# Patient Record
Sex: Female | Born: 1960 | Race: White | Hispanic: No | Marital: Married | Smoking: Never smoker
Health system: Southern US, Community
[De-identification: ages and names within clinical notes are randomized; demographics above are authoritative.]

## PROBLEM LIST (undated history)

## (undated) DIAGNOSIS — F419 Anxiety disorder, unspecified: Secondary | ICD-10-CM

## (undated) DIAGNOSIS — B001 Herpesviral vesicular dermatitis: Secondary | ICD-10-CM

## (undated) HISTORY — DX: Herpesviral vesicular dermatitis: B00.1

## (undated) HISTORY — DX: Anxiety disorder, unspecified: F41.9

---

## 2000-03-25 ENCOUNTER — Emergency Department (HOSPITAL_COMMUNITY): Admission: EM | Admit: 2000-03-25 | Discharge: 2000-03-25 | Payer: Self-pay | Admitting: Emergency Medicine

## 2000-03-26 ENCOUNTER — Encounter: Payer: Self-pay | Admitting: Emergency Medicine

## 2002-03-03 ENCOUNTER — Other Ambulatory Visit: Admission: RE | Admit: 2002-03-03 | Discharge: 2002-03-03 | Payer: Self-pay | Admitting: Family Medicine

## 2005-06-23 ENCOUNTER — Emergency Department (HOSPITAL_COMMUNITY): Admission: EM | Admit: 2005-06-23 | Discharge: 2005-06-23 | Payer: Self-pay | Admitting: Emergency Medicine

## 2005-07-15 HISTORY — PX: ANKLE ARTHROPLASTY: SUR68

## 2005-10-01 ENCOUNTER — Other Ambulatory Visit: Admission: RE | Admit: 2005-10-01 | Discharge: 2005-10-01 | Payer: Self-pay | Admitting: Family Medicine

## 2005-12-23 ENCOUNTER — Emergency Department (HOSPITAL_COMMUNITY): Admission: EM | Admit: 2005-12-23 | Discharge: 2005-12-23 | Payer: Self-pay | Admitting: Emergency Medicine

## 2006-01-06 ENCOUNTER — Inpatient Hospital Stay (HOSPITAL_COMMUNITY): Admission: EM | Admit: 2006-01-06 | Discharge: 2006-01-09 | Payer: Self-pay | Admitting: Emergency Medicine

## 2007-02-24 IMAGING — CR DG ANKLE PORT 2V*L*
2 series · 2 of 2 positions shown · non-contrast
Comparison: none

CLINICAL DATA: Fell down steps, ankle deformity. 
 PORTABLE LEFT ANKLE ? 2 VIEW:

[view not recorded (1 of 2)]
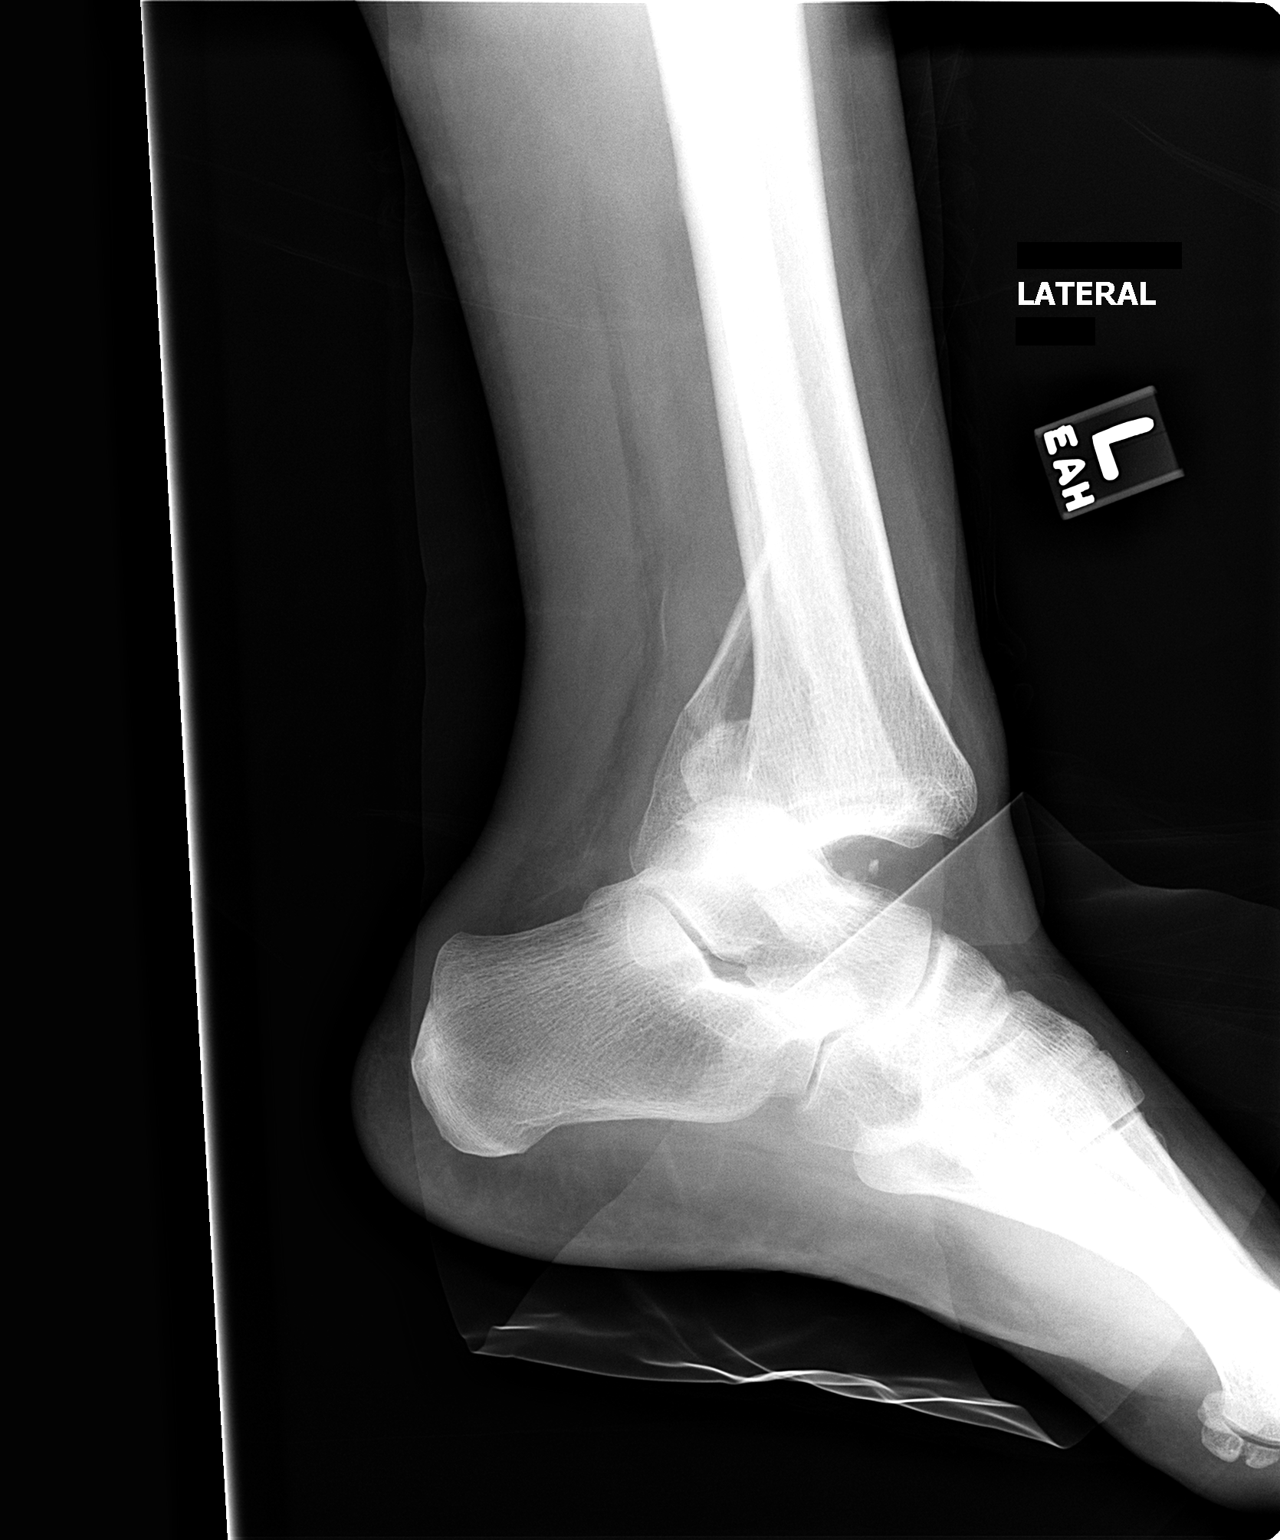

[view not recorded (2 of 2)]
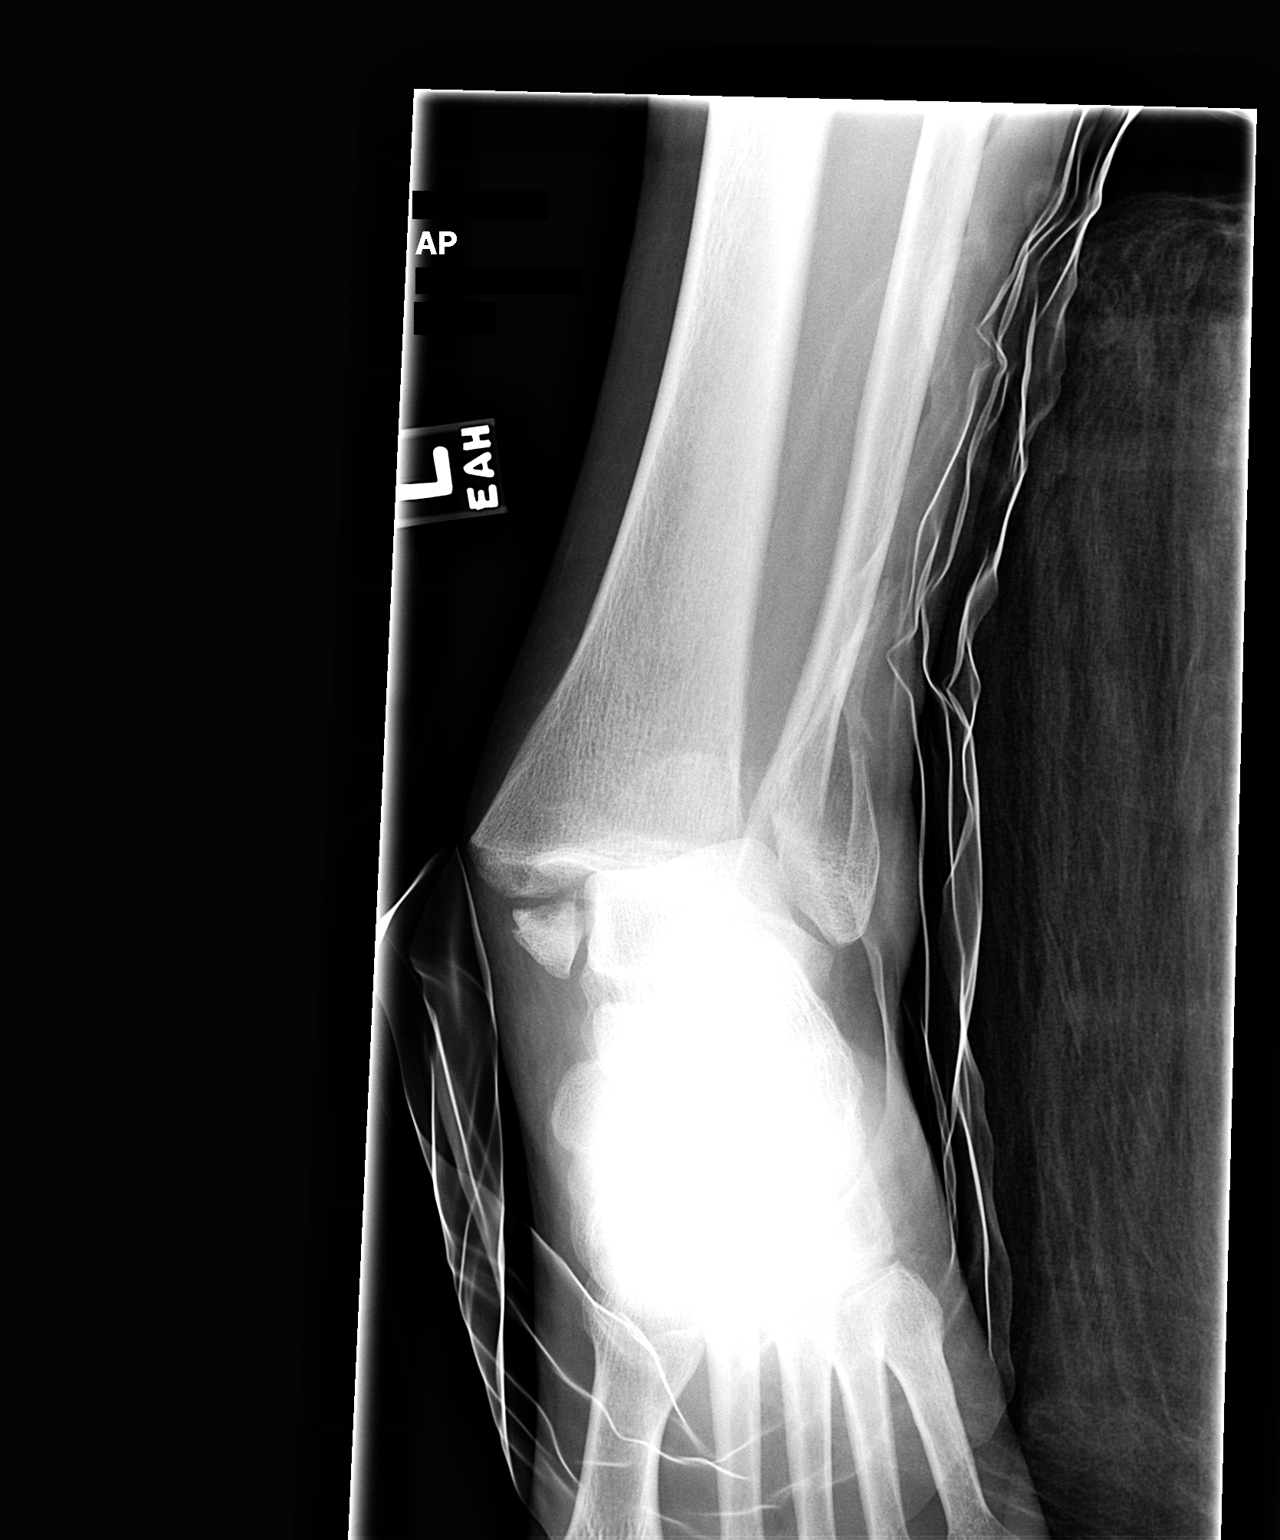

[2 of 2 positions shown; findings below may reference images not displayed]

FINDINGS: There is a trimalleolar fracture/dislocation.  The talus is dislocated posteriorly relative to the distal tibial articular surface. There is a fracture of the posterior lip of the tibia, impacted upon the talar dome.  There is an oblique fracture of the distal fibula.  There is a transverse fracture of the medial malleolus.
IMPRESSION: Trimalleolar fracture/dislocation.

## 2007-02-24 IMAGING — CR DG ANKLE PORT 2V*L*
3 series · 3 of 3 positions shown · non-contrast
Comparison: Earlier films from today.

CLINICAL DATA: Left ankle fracture-dislocation.
 PORTABLE LEFT ANKLE ? 01/06/06:

[view not recorded (1 of 3)]
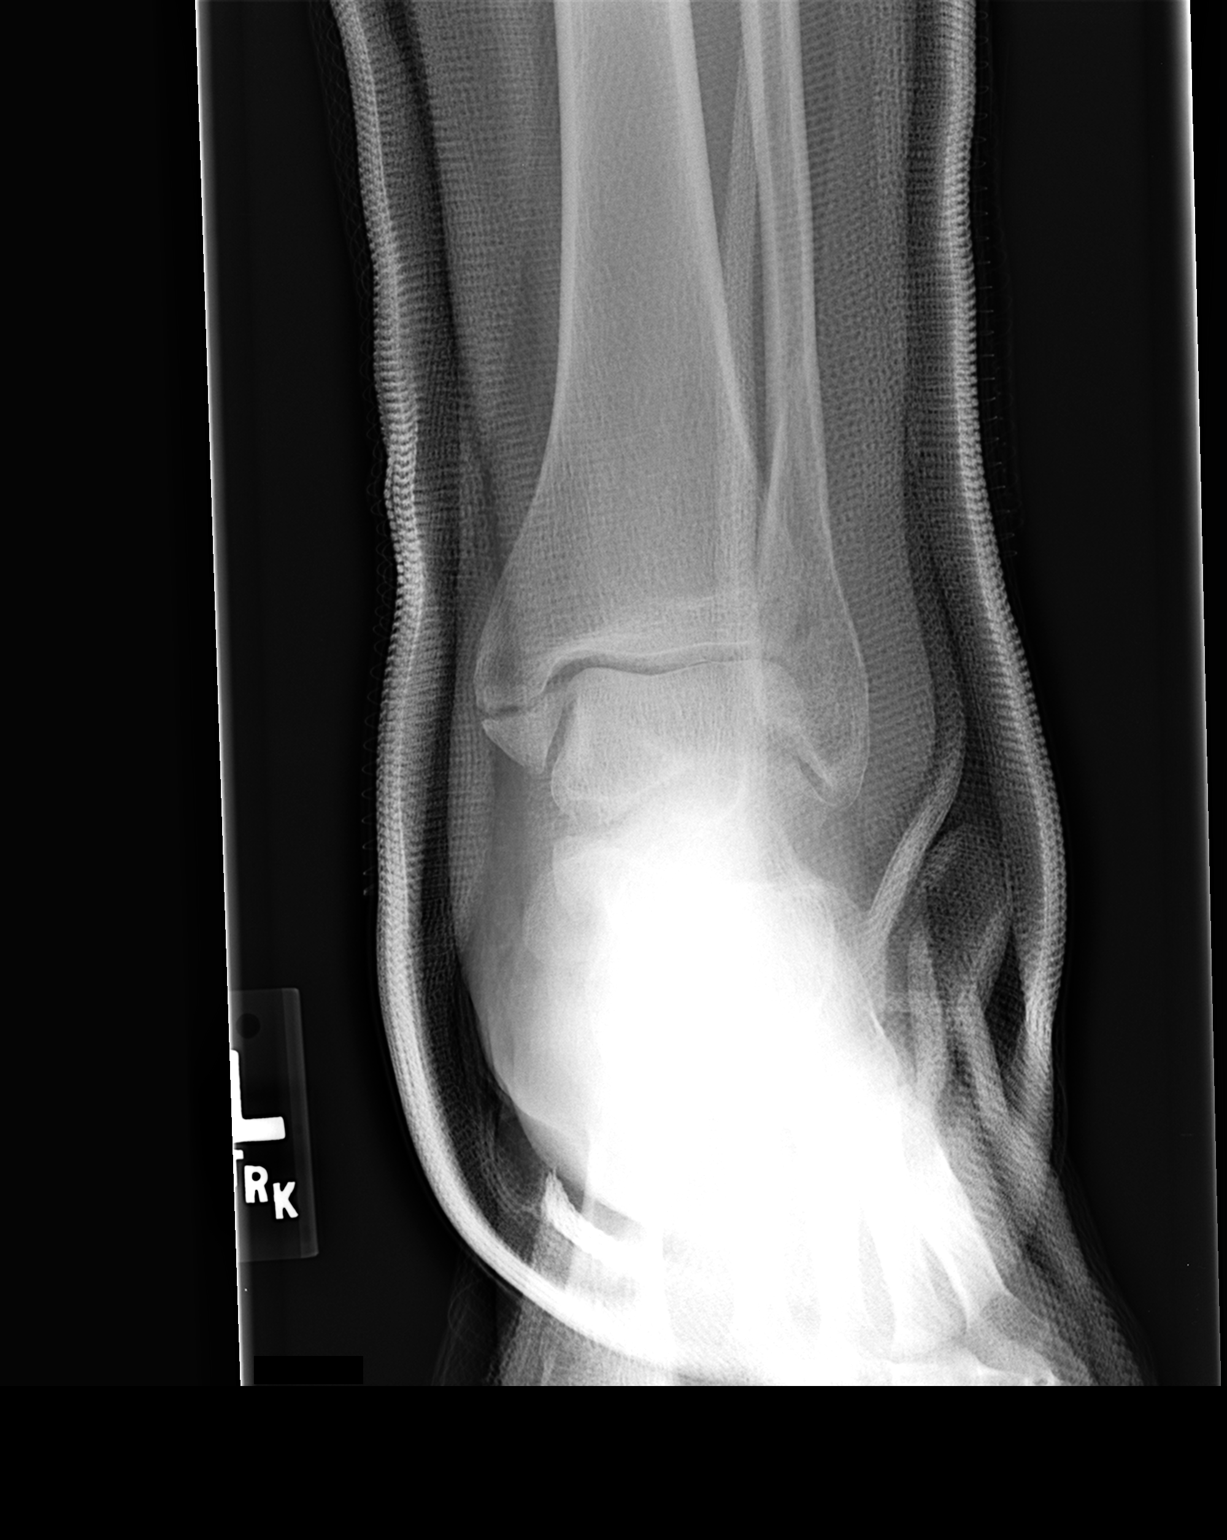

[view not recorded (2 of 3)]
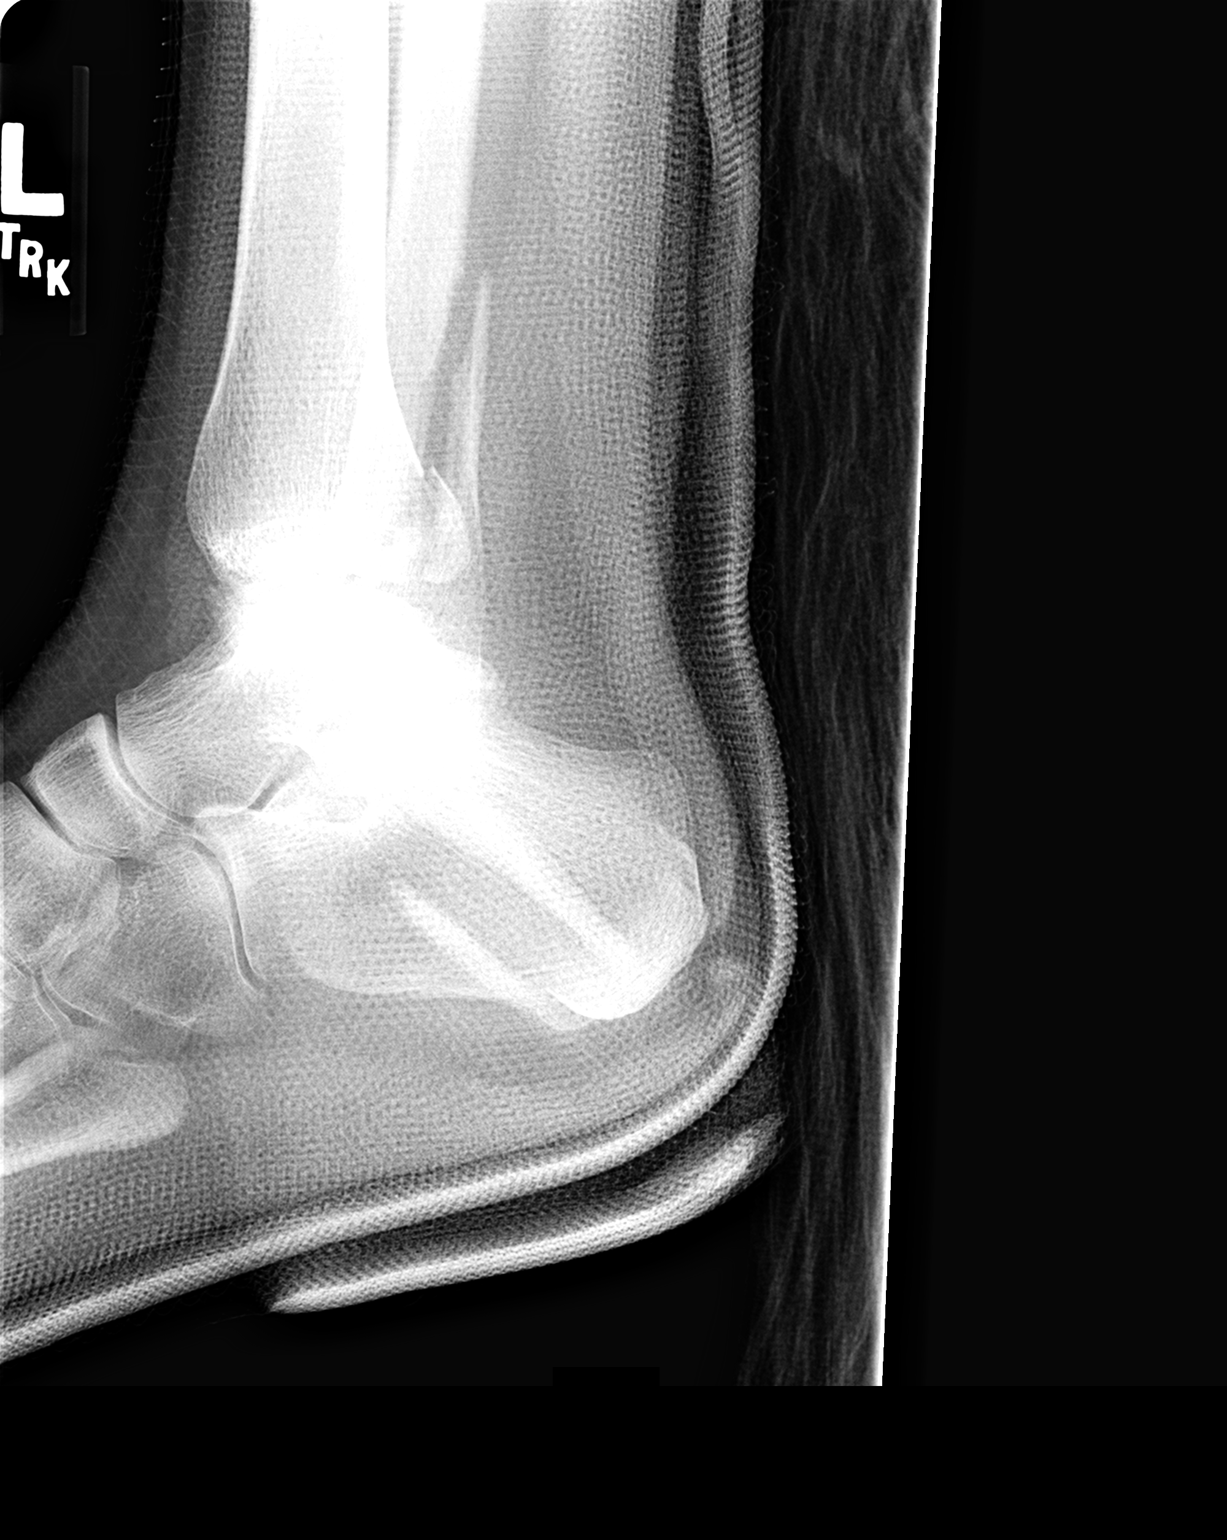

[view not recorded (3 of 3)]
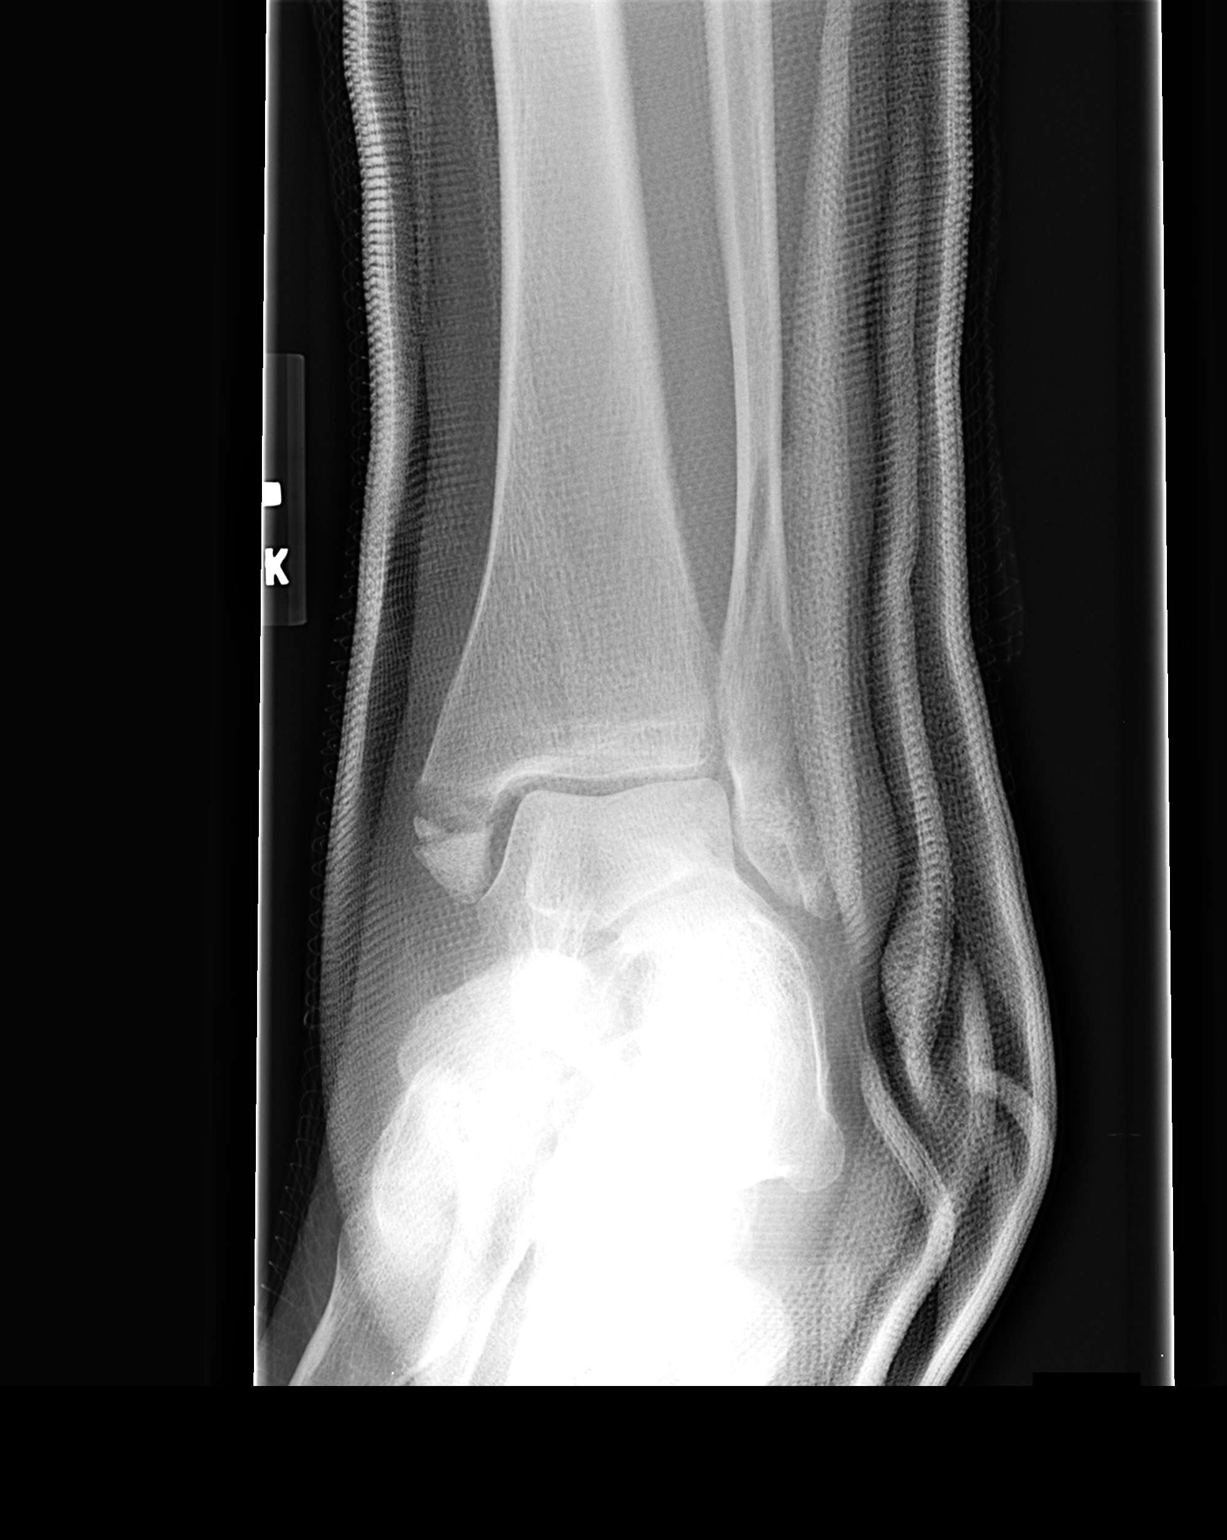

[3 of 3 positions shown; findings below may reference images not displayed]

FINDINGS: Interval reduction of the left ankle fracture-dislocation. The patient is in a fiberglass cast.  The ankle mortise is fairly well maintained.
IMPRESSION: Reduction of left ankle fracture-dislocation.

## 2010-08-06 ENCOUNTER — Encounter: Payer: Self-pay | Admitting: Family Medicine

## 2013-11-01 ENCOUNTER — Ambulatory Visit: Payer: Self-pay | Admitting: Podiatry

## 2016-03-26 ENCOUNTER — Other Ambulatory Visit: Payer: Self-pay | Admitting: Family Medicine

## 2016-03-26 DIAGNOSIS — Z1231 Encounter for screening mammogram for malignant neoplasm of breast: Secondary | ICD-10-CM

## 2016-04-15 ENCOUNTER — Ambulatory Visit
Admission: RE | Admit: 2016-04-15 | Discharge: 2016-04-15 | Disposition: A | Discharge status: Home / Self Care | Payer: BC Managed Care – PPO | Source: Ambulatory Visit | Attending: Family Medicine | Admitting: Family Medicine

## 2016-04-15 DIAGNOSIS — Z1231 Encounter for screening mammogram for malignant neoplasm of breast: Secondary | ICD-10-CM

## 2016-07-15 HISTORY — PX: TOE SURGERY: SHX1073

## 2019-06-18 ENCOUNTER — Other Ambulatory Visit: Payer: Self-pay | Admitting: Family Medicine

## 2019-06-18 ENCOUNTER — Other Ambulatory Visit (HOSPITAL_COMMUNITY)
Admission: RE | Admit: 2019-06-18 | Discharge: 2019-06-18 | Disposition: A | Discharge status: Home / Self Care | Payer: BC Managed Care – PPO | Source: Ambulatory Visit | Attending: Family Medicine | Admitting: Family Medicine

## 2019-06-18 DIAGNOSIS — Z124 Encounter for screening for malignant neoplasm of cervix: Secondary | ICD-10-CM | POA: Insufficient documentation

## 2019-06-18 LAB — RESULTS CONSOLE HPV: CHL HPV: NEGATIVE

## 2019-06-18 LAB — HM PAP SMEAR: HM Pap smear: NEGATIVE

## 2019-06-22 LAB — CYTOLOGY - PAP
Comment: NEGATIVE
Diagnosis: NEGATIVE
High risk HPV: NEGATIVE

## 2021-05-17 LAB — HM MAMMOGRAPHY

## 2021-06-14 LAB — HM COLONOSCOPY

## 2022-01-25 ENCOUNTER — Ambulatory Visit: Payer: BC Managed Care – PPO | Admitting: Podiatry

## 2022-02-01 ENCOUNTER — Ambulatory Visit: Payer: BC Managed Care – PPO | Admitting: Podiatry

## 2022-02-13 ENCOUNTER — Ambulatory Visit (INDEPENDENT_AMBULATORY_CARE_PROVIDER_SITE_OTHER): Payer: BC Managed Care – PPO

## 2022-02-13 ENCOUNTER — Ambulatory Visit: Payer: BC Managed Care – PPO | Admitting: Podiatry

## 2022-02-13 DIAGNOSIS — M21622 Bunionette of left foot: Secondary | ICD-10-CM | POA: Diagnosis not present

## 2022-02-13 DIAGNOSIS — M24575 Contracture, left foot: Secondary | ICD-10-CM | POA: Diagnosis not present

## 2022-02-13 DIAGNOSIS — M2042 Other hammer toe(s) (acquired), left foot: Secondary | ICD-10-CM | POA: Diagnosis not present

## 2022-02-13 DIAGNOSIS — Z01818 Encounter for other preprocedural examination: Secondary | ICD-10-CM

## 2022-02-13 NOTE — Progress Notes (Signed)
Subjective:  Patient ID: Jennifer Soto, female    DOB: 1961-03-08,  MRN: [id number]  Chief Complaint  Patient presents with   Foot Pain     hx of left foot surgery toes turning(hammertoe) bil foot pain, hammer toes on bilateral feet, no pain. Patient experiencing numbness and tingling intermittently     61 y.o. female presents with the above complaint.  Patient presents with painful left fourth and fifth hammertoe contracture with pain on palpation.  Patient also has a left tailor's bunion which seems to hurt with ambulation issues.  She has not seen anyone else prior to seeing me.  She denies any other acute complaints she would like to discuss treatment options for some time to get numbness tingling from the pain.  She has tried shoe gear modification padding protecting offloading all conservative ankles and treatments.  She is known to Dr. Grandville Silos who had done most of the conservative treatment options she would like to discuss surgical options at this time.  Previous bunion surgeries were done by Dr. Grandville Silos   Review of Systems: Negative except as noted in the HPI. Denies N/V/F/Ch.  No past medical history on file.  Current Outpatient Medications:    acyclovir cream (ZOVIRAX) 5 %, 1 application to affected area 5 times a day, Disp: , Rfl:    citalopram (CELEXA) 40 MG tablet, Take 40 mg by mouth daily., Disp: , Rfl:    estradiol (ESTRACE) 0.1 MG/GM vaginal cream, See admin instructions., Disp: , Rfl:    valACYclovir (VALTREX) 1000 MG tablet, take 2 tablets every 12 hours are directed for 30 days, Disp: , Rfl:   Social History   Tobacco Use  Smoking Status Not on file  Smokeless Tobacco Not on file    No Known Allergies Objective:  There were no vitals filed for this visit. There is no height or weight on file to calculate BMI. Constitutional Well developed. Well nourished.  Vascular Dorsalis pedis pulses palpable bilaterally. Posterior tibial pulses palpable  bilaterally. Capillary refill normal to all digits.  No cyanosis or clubbing noted. Pedal hair growth normal.  Neurologic Normal speech. Oriented to person, place, and time. Epicritic sensation to light touch grossly present bilaterally.  Dermatologic Nails well groomed and normal in appearance. No open wounds. No skin lesions.  Orthopedic: Pain on palpation left fourth and fifth hammertoe contracture.  Pain on palpation to the tailor's bunion prominent lateral eminence noted.  No extensor or flexor tendinitis noted.  These are semireducible deformities   Radiographs: 3 views of skeletally mature adult left foot: Previous hardware noted which appears to be intact no signs of backing or loosening noted.  Hammertoe contracture fourth and fifth digit noted.  Tailor's bunion noted with mild increase in intermetatarsal angle Assessment:   1. Hammer toe of left foot   2. Joint contracture of foot, left   3. Tailor's bunion of left foot   4. Encounter for preoperative examination for general surgical procedure    Plan:  Patient was evaluated and treated and all questions answered.  Left fourth and fifth hammertoe contracture painful with tailor's bunion -All questions and concerns were discussed with the patient in extensive detail.  Given the amount of pain that she is experiencing she will benefit from surgical evaluation.  She has failed all conservative treatment options under Dr. Grandville Silos at this time she would like to discuss surgical options. -I discussed my preoperative intra postoperative plan in extensive detail.  I will plan on doing left  fourth fifth hammertoe PIPJ arthroplasty with capsulotomy of fourth metatarsophalangeal joint with possible wire fixation.  I will also plan on doing tailor's bunion with reverse Wilson procedure with screw fixation. -Informed surgical risk consent was reviewed and read aloud to the patient.  I reviewed the films.  I have discussed my findings with the  patient in great detail.  I have discussed all risks including but not limited to infection, stiffness, scarring, limp, disability, deformity, damage to blood vessels and nerves, numbness, poor healing, need for braces, arthritis, chronic pain, amputation, death.  All benefits and realistic expectations discussed in great detail.  I have made no promises as to the outcome.  I have provided realistic expectations.  I have offered the patient a 2nd opinion, which they have declined and assured me they preferred to proceed despite the risks   No follow-ups on file. 3  Left fourth and fifth hammertoe with a possible capsulotomy And left tailor's bunionsurgery surgical shoe was dispensed.  Had history of surgery with Dr. Grandville Silos

## 2022-04-04 ENCOUNTER — Telehealth: Payer: Self-pay | Admitting: Urology

## 2022-04-04 NOTE — Telephone Encounter (Signed)
DOS - 04/29/22  TAILORS BUNIONECTOMY LEFT --- [id number] HAMMERTOE REPAIR 4,5 LEFT --- [id number] CAPSULOTOMY MPJ RELEASE 4TH LEFT --- [id number]  [employer] EFFECTIVE DATE - 07/15/21  PLAN DEDUCTIBLE - $1,500.00 W/ $1,500.00 REMAINING OUT OF POCKET - $5,900.00 W/ $6,720.94 REMAINING COINSURANCE - 30% COPAY - $0.00   NO PRIOR AUTH IS REQUIRED.

## 2022-04-22 ENCOUNTER — Other Ambulatory Visit: Payer: Self-pay | Admitting: Nurse Practitioner

## 2022-04-22 ENCOUNTER — Encounter: Payer: Self-pay | Admitting: Nurse Practitioner

## 2022-04-22 ENCOUNTER — Other Ambulatory Visit: Payer: Self-pay

## 2022-04-22 ENCOUNTER — Ambulatory Visit: Payer: BC Managed Care – PPO | Admitting: Nurse Practitioner

## 2022-04-22 VITALS — BP 136/76 | HR 65 | Temp 97.3°F | Ht 65.0 in | Wt 173.0 lb

## 2022-04-22 DIAGNOSIS — Z1231 Encounter for screening mammogram for malignant neoplasm of breast: Secondary | ICD-10-CM

## 2022-04-22 DIAGNOSIS — F411 Generalized anxiety disorder: Secondary | ICD-10-CM | POA: Insufficient documentation

## 2022-04-22 DIAGNOSIS — Z23 Encounter for immunization: Secondary | ICD-10-CM | POA: Diagnosis not present

## 2022-04-22 DIAGNOSIS — N941 Unspecified dyspareunia: Secondary | ICD-10-CM | POA: Diagnosis not present

## 2022-04-22 DIAGNOSIS — Z124 Encounter for screening for malignant neoplasm of cervix: Secondary | ICD-10-CM

## 2022-04-22 DIAGNOSIS — Z0000 Encounter for general adult medical examination without abnormal findings: Secondary | ICD-10-CM | POA: Diagnosis not present

## 2022-04-22 DIAGNOSIS — E663 Overweight: Secondary | ICD-10-CM | POA: Diagnosis not present

## 2022-04-22 DIAGNOSIS — Z114 Encounter for screening for human immunodeficiency virus [HIV]: Secondary | ICD-10-CM

## 2022-04-22 DIAGNOSIS — Z8619 Personal history of other infectious and parasitic diseases: Secondary | ICD-10-CM

## 2022-04-22 DIAGNOSIS — Z1159 Encounter for screening for other viral diseases: Secondary | ICD-10-CM

## 2022-04-22 LAB — RESULTS CONSOLE HPV: CHL HPV: NEGATIVE

## 2022-04-22 LAB — HM PAP SMEAR: HM Pap smear: NEGATIVE

## 2022-04-22 MED ORDER — CITALOPRAM HYDROBROMIDE 40 MG PO TABS: 40.0000 mg | ORAL_TABLET | Freq: Every day | ORAL | 1 refills | Status: DC

## 2022-04-22 MED ORDER — ESTRADIOL 0.1 MG/GM VA CREA: 1.0000 | TOPICAL_CREAM | VAGINAL | 0 refills | Status: DC

## 2022-04-22 MED ORDER — ACYCLOVIR 5 % EX CREA: TOPICAL_CREAM | CUTANEOUS | 0 refills | Status: DC

## 2022-04-22 MED ORDER — VALACYCLOVIR HCL 1 G PO TABS: 2000.0000 mg | ORAL_TABLET | Freq: Two times a day (BID) | ORAL | 0 refills | Status: DC

## 2022-04-22 NOTE — Assessment & Plan Note (Signed)
Encouraged healthy lifestyle modifications in regards to exercise did encourage 30 minutes of exercise 5 times a week.

## 2022-04-22 NOTE — Assessment & Plan Note (Signed)
Patient states she has been using vaginal cream twice weekly it seems to help with the dyspareunia.  Vaginal exam along with Pap smear today.

## 2022-04-22 NOTE — Assessment & Plan Note (Signed)
Discussed age-appropriate immunizations and screening exams.  Appropriate orders placed today.  Did attach preventative healthcare maintenance information at dismissal of clinic.  Did review seasonal immunizations and make sure they were up-to-date.  Patient is in a monogamous relationship and declines STI testing today.

## 2022-04-22 NOTE — Progress Notes (Signed)
New Patient Office Visit  Subjective    Patient ID: Jennifer Soto, female    DOB: [birth year]  Age: 61 y.o. MRN: [id number]  CC:  Chief Complaint  Patient presents with   Establish Care    Fasting    HPI Jennifer Soto presents to establish care  Cold sore: states that the valacyclovir and acyclovir cream she will use approximately 4 times a year  Dyspareunia: estrace topical cream twice a week.  Patient states this works well.  She had a length of time of approximate 27 years patient was single and not having intercourse.  Has been married as of late.  Anxiety: Citalopram daily. No history  of thepray. No other medications. No Hi/Si/ AVH  for complete physical and follow up of chronic conditions.  Immunizations: -Tetanus: update today -Influenza: 04/15/2022 -Shingles: Up-to-date -Pneumonia: Too young  -HPV: age out  Diet: Fair diet. 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 States that she will have a light breakfast, lunch and dinner at home mainly. Some water. Will drink tea and soda. Exercise: No regular exercise.  Eye exam: Completes annually. Contacts and glasses. [facility] exam: Completes semi-annually   Pap Smear: Completed in today Mammogram: Order placed today for normal breast center  Colonoscopy: Completed i at [facility] within 5 years. normal.  Pending records Lung Cancer Screening: N/A Dexa: N/A   Sleep: 10 and get up around 7am. States it takes some time to get to sleep. Uses a bear gummy.  Did try a controlled release medication in the past that was ineffective per patient report.    Outpatient Encounter Medications as of 04/22/2022  Medication Sig   [DISCONTINUED] acyclovir cream (ZOVIRAX) 5 % 1 application to affected area 5 times a day   [DISCONTINUED] citalopram (CELEXA) 40 MG tablet Take 40 mg by mouth daily.   [DISCONTINUED] estradiol (ESTRACE) 0.1 MG/GM vaginal cream See admin instructions.   [DISCONTINUED] valACYclovir (VALTREX)  1000 MG tablet take 2 tablets every 12 hours are directed for 30 days   acyclovir cream (ZOVIRAX) 5 % 1 application to affected area 5 times a day   citalopram (CELEXA) 40 MG tablet Take 1 tablet (40 mg total) by mouth daily.   estradiol (ESTRACE) 0.1 MG/GM vaginal cream Place 1 Applicatorful vaginally 2 (two) times a week.   valACYclovir (VALTREX) 1000 MG tablet Take 2 tablets (2,000 mg total) by mouth 2 (two) times daily. For one day   No facility-administered encounter medications on file as of 04/22/2022.    Past Medical History:  Diagnosis Date   Anxiety    Cold sore     Past Surgical History:  Procedure Laterality Date   ANKLE ARTHROPLASTY Left 2007   CESAREAN SECTION     x2   TOE SURGERY Left 2018    Family History  Problem Relation Age of Onset   Thyroid disease Mother    Arrhythmia Father        afib    Social History   Socioeconomic History   Marital status: Married    Spouse name: Birtie Fellman   Number of children: 2   Years of education: Not on file   Highest education level: Not on file  Occupational History   Not on file  Tobacco Use   Smoking status: Never   Smokeless tobacco: Never  Vaping Use   Vaping Use: Never used  Substance and Sexual Activity   Alcohol use: Yes    Alcohol/week: 1.0 standard  drink of alcohol    Types: 1 Glasses of wine per week    Comment: a month   Drug use: Never   Sexual activity: Yes    Birth control/protection: Post-menopausal  Other Topics Concern   Not on file  Social History Narrative   Matt,35 Earnest Bailey 5      Retired: worked for Environmental health practitioner at [place] Strain: Not on [employer] Insecurity: Not on file  Transportation Needs: Not on file  Physical Activity: Not on file  Stress: Not on file  Social Connections: Not on file  Intimate Partner Violence: Not on file    Review of Systems  Constitutional:  Negative for chills and fever.   Respiratory:  Negative for shortness of breath.   Cardiovascular:  Negative for chest pain and leg swelling.  Gastrointestinal:  Negative for abdominal pain, constipation, diarrhea, nausea and vomiting.       BM every day to every other day   Genitourinary:  Negative for dysuria and hematuria.  Neurological:  Negative for tingling and headaches.  Psychiatric/Behavioral:  Negative for hallucinations and suicidal ideas.         Objective    BP 136/76   Pulse 65   Temp (!) 97.3 F (36.3 C) (Temporal)   Ht 5\' 5"  (1.651 m)   Wt 173 lb (78.5 kg)   SpO2 95%   BMI 28.79 kg/m   Physical Exam Vitals and nursing note reviewed. Exam conducted with a chaperone present Claiborne Billings [place], CMA).  Constitutional:      Appearance: Normal appearance.  HENT:     Right Ear: Tympanic membrane, ear canal and external ear normal.     Left Ear: Tympanic membrane, ear canal and external ear normal.     Mouth/Throat:     Mouth: Mucous membranes are moist.     Pharynx: Oropharynx is clear.  Eyes:     Extraocular Movements: Extraocular movements intact.     Pupils: Pupils are equal, round, and reactive to light.  Cardiovascular:     Rate and Rhythm: Normal rate and regular rhythm.     Pulses: Normal pulses.     Heart sounds: Normal heart sounds.  Pulmonary:     Effort: Pulmonary effort is normal.     Breath sounds: Normal breath sounds.  Abdominal:     General: Bowel sounds are normal. There is no distension.     Palpations: There is no mass.     Tenderness: There is no abdominal tenderness.     Hernia: No hernia is present.  Genitourinary:    Exam position: Lithotomy position.     Vagina: Normal.     Cervix: Normal.     Uterus: Normal.      Adnexa: Right adnexa normal and left adnexa normal.  Musculoskeletal:     Right lower leg: No edema.     Left lower leg: No edema.  Lymphadenopathy:     Cervical: No cervical adenopathy.  Skin:    General: Skin is warm.  Neurological:      General: No focal deficit present.     Mental Status: She is alert.     Deep Tendon Reflexes:     Reflex Scores:      Bicep reflexes are 2+ on the right side and 2+ on the left side.      Patellar reflexes are 2+ on the right side and 2+ on the left side.  Comments: Bilateral upper and lower extremity strength 5/5.  Psychiatric:        Mood and Affect: Mood normal.        Behavior: Behavior normal.        Thought Content: Thought content normal.        Judgment: Judgment normal.         Assessment & Plan:   Problem List Items Addressed This Visit       Other   Preventative health care - Primary    Discussed age-appropriate immunizations and screening exams.  Appropriate orders placed today.  Did attach preventative healthcare maintenance information at dismissal of clinic.  Did review seasonal immunizations and make sure they were up-to-date.  Patient is in a monogamous relationship and declines STI testing today.      Relevant Orders   CBC   Comprehensive metabolic panel   Hemoglobin A1c   TSH   Dyspareunia in female    Patient states she has been using vaginal cream twice weekly it seems to help with the dyspareunia.  Vaginal exam along with Pap smear today.      Relevant Medications   estradiol (ESTRACE) 0.1 MG/GM vaginal cream   GAD (generalized anxiety disorder)    Patient currently maintained on citalopram 40 mg.  Stable.  Continue medication refill sent today      Relevant Medications   citalopram (CELEXA) 40 MG tablet   Overweight    Encouraged healthy lifestyle modifications in regards to exercise did encourage 30 minutes of exercise 5 times a week.      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  Patient will use oral valacyclovir and topical acyclovir as needed.  Refills for both medications at the pharmacy.      Relevant Medications   valACYclovir (VALTREX)  1000 MG tablet   acyclovir cream (ZOVIRAX) 5 %   Other Visit Diagnoses     Encounter for screening mammogram for malignant neoplasm of breast       Relevant Orders   MM Digital Screening   Screening for HIV (human immunodeficiency virus)       Relevant Medications   valACYclovir (VALTREX) 1000 MG tablet   Other Relevant Orders   HIV Antibody (routine testing w rflx)   Need for hepatitis C screening test       Relevant Orders   Hepatitis C Antibody   Screening for cervical cancer       Relevant Orders   Cytology - PAP([facility])   Need for Tdap vaccination       Relevant Orders   Tdap vaccine greater than or equal to 7yo IM       Return in about 1 year (around 04/23/2023) for CPE and Labs.   Audria Nine, NP

## 2022-04-22 NOTE — Assessment & Plan Note (Signed)
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  Patient will use oral valacyclovir and topical acyclovir as needed.  Refills for both medications at the pharmacy.

## 2022-04-22 NOTE — Patient Instructions (Signed)
Nice to see you today I will be in touch with the labs I want to see you in 1 year for your next physical, sooner if you need me

## 2022-04-22 NOTE — Assessment & Plan Note (Signed)
Patient currently maintained on citalopram 40 mg.  Stable.  Continue medication refill sent today

## 2022-04-23 LAB — COMPREHENSIVE METABOLIC PANEL
ALT: 20 U/L (ref 0–35)
AST: 18 U/L (ref 0–37)
Albumin: 4.3 g/dL (ref 3.5–5.2)
Alkaline Phosphatase: 80 U/L (ref 39–117)
BUN: 15 mg/dL (ref 6–23)
CO2: 28 mEq/L (ref 19–32)
Calcium: 9.3 mg/dL (ref 8.4–10.5)
Chloride: 104 mEq/L (ref 96–112)
Creatinine, Ser: 0.84 mg/dL (ref 0.40–1.20)
GFR: 75.02 mL/min (ref >60.00)
Glucose, Bld: 95 mg/dL (ref 70–99)
Potassium: 4.4 mEq/L (ref 3.5–5.1)
Sodium: 141 mEq/L (ref 135–145)
Total Bilirubin: 0.4 mg/dL (ref 0.2–1.2)
Total Protein: 7.1 g/dL (ref 6.0–8.3)

## 2022-04-23 LAB — HIV ANTIBODY (ROUTINE TESTING W REFLEX): HIV 1&2 Ab, 4th Generation: NONREACTIVE

## 2022-04-23 LAB — LIPID PANEL
Cholesterol: 248 mg/dL — ABNORMAL HIGH (ref 0–200)
HDL: 39.7 mg/dL (ref >39.00)
NonHDL: 208.78
Total CHOL/HDL Ratio: 6
Triglycerides: 262 mg/dL — ABNORMAL HIGH (ref 0.0–149.0)
VLDL: 52.4 mg/dL — ABNORMAL HIGH (ref 0.0–40.0)

## 2022-04-23 LAB — HEMOGLOBIN A1C: Hgb A1c MFr Bld: 5.5 % (ref 4.6–6.5)

## 2022-04-23 LAB — LDL CHOLESTEROL, DIRECT: Direct LDL: 183 mg/dL

## 2022-04-23 LAB — CBC
HCT: 38.3 % (ref 36.0–46.0)
Hemoglobin: 13.5 g/dL (ref 12.0–15.0)
MCHC: 35.2 g/dL (ref 30.0–36.0)
MCV: 88.8 fl (ref 78.0–100.0)
Platelets: 183 10*3/uL (ref 150.0–400.0)
RBC: 4.31 Mil/uL (ref 3.87–5.11)
RDW: 13.1 % (ref 11.5–15.5)
WBC: 6.7 10*3/uL (ref 4.0–10.5)

## 2022-04-23 LAB — TSH: TSH: 0.9 u[IU]/mL (ref 0.35–5.50)

## 2022-04-23 LAB — HEPATITIS C ANTIBODY: Hepatitis C Ab: NONREACTIVE

## 2022-04-29 ENCOUNTER — Other Ambulatory Visit: Payer: Self-pay | Admitting: Podiatry

## 2022-04-29 ENCOUNTER — Encounter: Payer: Self-pay | Admitting: Podiatry

## 2022-04-29 DIAGNOSIS — M21542 Acquired clubfoot, left foot: Secondary | ICD-10-CM | POA: Diagnosis not present

## 2022-04-29 DIAGNOSIS — M2042 Other hammer toe(s) (acquired), left foot: Secondary | ICD-10-CM | POA: Diagnosis not present

## 2022-04-29 LAB — CYTOLOGY - PAP

## 2022-04-29 MED ORDER — IBUPROFEN 800 MG PO TABS: 800.0000 mg | ORAL_TABLET | Freq: Four times a day (QID) | ORAL | 1 refills | Status: DC | PRN

## 2022-04-29 MED ORDER — OXYCODONE-ACETAMINOPHEN 5-325 MG PO TABS: 1.0000 | ORAL_TABLET | ORAL | 0 refills | Status: DC | PRN

## 2022-05-06 ENCOUNTER — Telehealth: Payer: Self-pay | Admitting: Nurse Practitioner

## 2022-05-06 ENCOUNTER — Encounter: Payer: Self-pay | Admitting: Nurse Practitioner

## 2022-05-06 NOTE — Telephone Encounter (Signed)
Patient advised.

## 2022-05-06 NOTE — Telephone Encounter (Signed)
Can we let the patient know that her pap smear came back looking normal

## 2022-05-08 ENCOUNTER — Ambulatory Visit (INDEPENDENT_AMBULATORY_CARE_PROVIDER_SITE_OTHER): Payer: BC Managed Care – PPO

## 2022-05-08 ENCOUNTER — Ambulatory Visit (INDEPENDENT_AMBULATORY_CARE_PROVIDER_SITE_OTHER): Payer: BC Managed Care – PPO | Admitting: Podiatry

## 2022-05-08 DIAGNOSIS — M21622 Bunionette of left foot: Secondary | ICD-10-CM

## 2022-05-08 DIAGNOSIS — M2042 Other hammer toe(s) (acquired), left foot: Secondary | ICD-10-CM

## 2022-05-08 DIAGNOSIS — M24575 Contracture, left foot: Secondary | ICD-10-CM

## 2022-05-08 DIAGNOSIS — Z9889 Other specified postprocedural states: Secondary | ICD-10-CM

## 2022-05-08 NOTE — Progress Notes (Signed)
  Subjective:  Patient ID: Jennifer Soto, female    DOB: [birth year],  MRN: [id number]  Chief Complaint  Patient presents with   Routine Post Op    POV #1 DOS 04/29/2022 LT 4TH/5TH HAMMERTOE CORRECTION W/POSS CAPSULOTOMY W/4TH MPJ, LT REPAIR OF TAILOR BUNION    DOS: 04/29/2022 Procedure: Left fourth and fifth hammertoe correction with capsulotomy of the fourth MPJ.  Left for tailor's bunion repair  61 y.o. female returns for post-op check.  Patient states she is doing good.  Minimal pain pain well controlled.  She has been walking a little bit on her foot with surgical shoe.  The pain is a little bit loose secondary to that.  She denies any nausea fever chills vomiting  Review of Systems: Negative except as noted in the HPI. Denies N/V/F/Ch.  Past Medical History:  Diagnosis Date   Anxiety    Cold sore     Current Outpatient Medications:    citalopram (CELEXA) 40 MG tablet, Take 1 tablet (40 mg total) by mouth daily., Disp: 90 tablet, Rfl: 1   estradiol (ESTRACE) 0.1 MG/GM vaginal cream, Place 1 Applicatorful vaginally 2 (two) times a week., Disp: 42.5 g, Rfl: 0   ibuprofen (ADVIL) 800 MG tablet, Take 1 tablet (800 mg total) by mouth every 6 (six) hours as needed., Disp: 60 tablet, Rfl: 1   oxyCODONE-acetaminophen (PERCOCET) 5-325 MG tablet, Take 1 tablet by mouth every 4 (four) hours as needed for severe pain., Disp: 30 tablet, Rfl: 0   penciclovir (DENAVIR) 1 % cream, Apply topically every 3 (three) hours while awake. For 4 days, Disp: 5 g, Rfl: 0   valACYclovir (VALTREX) 1000 MG tablet, Take 2 tablets (2,000 mg total) by mouth 2 (two) times daily. For one day, Disp: 15 tablet, Rfl: 0  Social History   Tobacco Use  Smoking Status Never  Smokeless Tobacco Never    No Known Allergies Objective:  There were no vitals filed for this visit. There is no height or weight on file to calculate BMI. Constitutional Well developed. Well nourished.  Vascular Foot warm and well  perfused. Capillary refill normal to all digits.   Neurologic Normal speech. Oriented to person, place, and time. Epicritic sensation to light touch grossly present bilaterally.  Dermatologic Skin healing well without signs of infection. Skin edges well coapted without signs of infection.  Orthopedic: Tenderness to palpation noted about the surgical site.   Radiographs: 3 views of skeletally mature adult left foot:The pain appears to be backing out from the fourth MPJ joint.  No breaking noted.  Hardware is intact.  She Assessment:   1. Hammer toe of left foot   2. Joint contracture of foot, left   3. Tailor's bunion of left foot   4. S/P foot surgery    Plan:  Patient was evaluated and treated and all questions answered.  S/p foot surgery orleft -Progressing as expected post-operatively. -XR: See above -WB Status: Weightbearing as tolerated in surgical shoe -Sutures: Intact.  No clinical signs of dehiscence noted no complication noted. -Medications: None -Foot redressed.  I discussed with the patient the pin has backed out a little bit.  We will continue to clinically monitor.  She states understanding  No follow-ups on file.

## 2022-05-09 ENCOUNTER — Other Ambulatory Visit: Payer: Self-pay | Admitting: Nurse Practitioner

## 2022-05-09 DIAGNOSIS — Z1231 Encounter for screening mammogram for malignant neoplasm of breast: Secondary | ICD-10-CM

## 2022-05-10 ENCOUNTER — Ambulatory Visit: Payer: BC Managed Care – PPO | Admitting: Podiatry

## 2022-05-10 ENCOUNTER — Ambulatory Visit (INDEPENDENT_AMBULATORY_CARE_PROVIDER_SITE_OTHER): Payer: BC Managed Care – PPO

## 2022-05-10 DIAGNOSIS — Z9889 Other specified postprocedural states: Secondary | ICD-10-CM

## 2022-05-10 DIAGNOSIS — M2042 Other hammer toe(s) (acquired), left foot: Secondary | ICD-10-CM

## 2022-05-10 NOTE — Progress Notes (Unsigned)
  Subjective:  Patient ID: Jennifer Soto, female    DOB: Jun 21, 1961,  MRN: [id number]  Chief Complaint  Patient presents with   Routine Post Op    Pt hit her toe on the bed frame wanted to have it checked     DOS: 04/29/2022 Procedure: Left fourth and fifth hammertoe correction with capsulotomy of the fourth MPJ.  Left for tailor's bunion repair  61 y.o. female returns for post-op check.  Patient states she is doing good.  Minimal pain pain well controlled.  Patient states she stubbed her toe and wanted to make sure that the pain is still intact.  Denies any other acute complaints  Review of Systems: Negative except as noted in the HPI. Denies N/V/F/Ch.  Past Medical History:  Diagnosis Date   Anxiety    Cold sore     Current Outpatient Medications:    citalopram (CELEXA) 40 MG tablet, Take 1 tablet (40 mg total) by mouth daily., Disp: 90 tablet, Rfl: 1   estradiol (ESTRACE) 0.1 MG/GM vaginal cream, Place 1 Applicatorful vaginally 2 (two) times a week., Disp: 42.5 g, Rfl: 0   ibuprofen (ADVIL) 800 MG tablet, Take 1 tablet (800 mg total) by mouth every 6 (six) hours as needed., Disp: 60 tablet, Rfl: 1   oxyCODONE-acetaminophen (PERCOCET) 5-325 MG tablet, Take 1 tablet by mouth every 4 (four) hours as needed for severe pain., Disp: 30 tablet, Rfl: 0   penciclovir (DENAVIR) 1 % cream, Apply topically every 3 (three) hours while awake. For 4 days, Disp: 5 g, Rfl: 0   valACYclovir (VALTREX) 1000 MG tablet, Take 2 tablets (2,000 mg total) by mouth 2 (two) times daily. For one day, Disp: 15 tablet, Rfl: 0  Social History   Tobacco Use  Smoking Status Never  Smokeless Tobacco Never    No Known Allergies Objective:  There were no vitals filed for this visit. There is no height or weight on file to calculate BMI. Constitutional Well developed. Well nourished.  Vascular Foot warm and well perfused. Capillary refill normal to all digits.   Neurologic Normal speech. Oriented to  person, place, and time. Epicritic sensation to light touch grossly present bilaterally.  Dermatologic Skin healing well without signs of infection. Skin edges well coapted without signs of infection.  Orthopedic: Tenderness to palpation noted about the surgical site.   Radiographs: 3 views of skeletally mature adult left foot:The pain appears to be backing out from the fourth MPJ joint.  Appears to be in same position as previous x-ray no breaking noted.  Hardware is intact.  She Assessment:   1. S/P foot surgery    Plan:  Patient was evaluated and treated and all questions answered.  S/p foot surgery orleft -Progressing as expected post-operatively. -XR: See above -WB Status: Weightbearing as tolerated in surgical shoe -Sutures: Intact.  No clinical signs of dehiscence noted no complication noted. -Medications: None -Foot redressed.  I discussed with the patient the pin has backed out a little bit.  We will continue to clinically monitor.  She states understanding -No acute change since stubbing her toe.  We will continue clinical monitoring  No follow-ups on file.

## 2022-05-22 ENCOUNTER — Ambulatory Visit (INDEPENDENT_AMBULATORY_CARE_PROVIDER_SITE_OTHER): Payer: BC Managed Care – PPO | Admitting: Podiatry

## 2022-05-22 DIAGNOSIS — M2042 Other hammer toe(s) (acquired), left foot: Secondary | ICD-10-CM

## 2022-05-22 DIAGNOSIS — Z9889 Other specified postprocedural states: Secondary | ICD-10-CM

## 2022-05-22 DIAGNOSIS — M24575 Contracture, left foot: Secondary | ICD-10-CM

## 2022-05-22 NOTE — Progress Notes (Signed)
  Subjective:  Patient ID: Jennifer Soto, female    DOB: 10-25-1960,  MRN: [id number]  Chief Complaint  Patient presents with   Routine Post Op    POV #2 DOS 04/29/2022 LT 4TH/5TH HAMMERTOE CORRECTION W/POSS CAPSULOTOMY W/4TH MPJ, LT REPAIR OF TAILOR BUNION    DOS: 04/29/2022 Procedure: Left 4th and 5th hammertoe correction Left tailor bunion  61 y.o. female returns for post-op check.  Patient states that she is doing okay minimal pain no acute complaints.  They to get her stitches out.  Review of Systems: Negative except as noted in the HPI. Denies N/V/F/Ch.  Past Medical History:  Diagnosis Date   Anxiety    Cold sore     Current Outpatient Medications:    citalopram (CELEXA) 40 MG tablet, Take 1 tablet (40 mg total) by mouth daily., Disp: 90 tablet, Rfl: 1   estradiol (ESTRACE) 0.1 MG/GM vaginal cream, Place 1 Applicatorful vaginally 2 (two) times a week., Disp: 42.5 g, Rfl: 0   ibuprofen (ADVIL) 800 MG tablet, Take 1 tablet (800 mg total) by mouth every 6 (six) hours as needed., Disp: 60 tablet, Rfl: 1   oxyCODONE-acetaminophen (PERCOCET) 5-325 MG tablet, Take 1 tablet by mouth every 4 (four) hours as needed for severe pain., Disp: 30 tablet, Rfl: 0   penciclovir (DENAVIR) 1 % cream, Apply topically every 3 (three) hours while awake. For 4 days, Disp: 5 g, Rfl: 0   valACYclovir (VALTREX) 1000 MG tablet, Take 2 tablets (2,000 mg total) by mouth 2 (two) times daily. For one day, Disp: 15 tablet, Rfl: 0  Social History   Tobacco Use  Smoking Status Never  Smokeless Tobacco Never    No Known Allergies Objective:  There were no vitals filed for this visit. There is no height or weight on file to calculate BMI. Constitutional Well developed. Well nourished.  Vascular Foot warm and well perfused. Capillary refill normal to all digits.   Neurologic Normal speech. Oriented to person, place, and time. Epicritic sensation to light touch grossly present bilaterally.   Dermatologic Skin healing well without signs of infection. Skin edges well coapted without signs of infection.  Orthopedic: Tenderness to palpation noted about the surgical site.   Radiographs: None Assessment:   1. S/P foot surgery   2. Hammer toe of left foot   3. Joint contracture of foot, left    Plan:  Patient was evaluated and treated and all questions answered.  S/p foot surgery left -Progressing as expected post-operatively. -XR: See above -WB Status: Weightbearing as tolerated in surgical shoe -Sutures: Removed.  No signs of dehiscence noted no complication noted. -Medications: None -We will plan on doing a pin removal in 2 weeks.  No follow-ups on file.

## 2022-05-24 ENCOUNTER — Encounter: Payer: Self-pay | Admitting: Family Medicine

## 2022-05-28 ENCOUNTER — Ambulatory Visit: Payer: BC Managed Care – PPO

## 2022-05-29 ENCOUNTER — Telehealth: Payer: Self-pay | Admitting: *Deleted

## 2022-05-29 ENCOUNTER — Other Ambulatory Visit: Payer: Self-pay | Admitting: *Deleted

## 2022-05-29 MED ORDER — DOXYCYCLINE HYCLATE 100 MG PO TABS: 100.0000 mg | ORAL_TABLET | Freq: Two times a day (BID) | ORAL | 0 refills | Status: DC

## 2022-05-29 NOTE — Telephone Encounter (Signed)
Doxycycline 28 pills sent to pharmacy on file, patient aware.

## 2022-05-29 NOTE — Telephone Encounter (Signed)
Patient is calling ,one of her 4 th, 5th toes are swollen,red and pain since 1 day ago, has an upcoming appointment on 22 th but does not think she should wait that long,please advise /schedule sooner.

## 2022-05-29 NOTE — Telephone Encounter (Signed)
Patient has been notified that medication has been sent in and will schedule a sooner appointment

## 2022-05-30 NOTE — Telephone Encounter (Signed)
Pt called to get a sooner appt and I offered her one for today in [place] but she could not come. She asked about Monday and Dr Allena Katz is not in the office on Mondays. She has decided to keep appt on 11.22.23. She said she has some swelling in the area and it has to go down before she see's the provider and she is taking some medication to help with the swelling.

## 2022-06-05 ENCOUNTER — Ambulatory Visit (INDEPENDENT_AMBULATORY_CARE_PROVIDER_SITE_OTHER): Payer: BC Managed Care – PPO | Admitting: Podiatry

## 2022-06-05 ENCOUNTER — Ambulatory Visit (INDEPENDENT_AMBULATORY_CARE_PROVIDER_SITE_OTHER): Payer: BC Managed Care – PPO

## 2022-06-05 DIAGNOSIS — M2042 Other hammer toe(s) (acquired), left foot: Secondary | ICD-10-CM

## 2022-06-05 DIAGNOSIS — Z9889 Other specified postprocedural states: Secondary | ICD-10-CM | POA: Diagnosis not present

## 2022-06-05 DIAGNOSIS — M24575 Contracture, left foot: Secondary | ICD-10-CM

## 2022-06-05 NOTE — Progress Notes (Signed)
  Subjective:  Patient ID: Jennifer Soto, female    DOB: [birth year],  MRN: [id number]  Chief Complaint  Patient presents with   Routine Post Op    Patient came in today for a POV #3 DOS 04/29/2022 LT 4TH/5TH HAMMERTOE CORRECTION W/POSS CAPSULOTOMY W/4TH MPJ, LT REPAIR OF TAILOR BUNION, patient states that her pin came out in the shower, patient is having some swelling and pain, TX: surgical shoe     DOS: 04/29/2022 Procedure: Left 4th and 5th hammertoe correction Left tailor bunion  61 y.o. female returns for post-op check.  Patient states that she is doing okay minimal pain no acute complaints.  She states the pain came out on her own few days ago.  Overall she still has a little bit of discomfort and some swelling.  Review of Systems: Negative except as noted in the HPI. Denies N/V/F/Ch.  Past Medical History:  Diagnosis Date   Anxiety    Cold sore     Current Outpatient Medications:    citalopram (CELEXA) 40 MG tablet, Take 1 tablet (40 mg total) by mouth daily., Disp: 90 tablet, Rfl: 1   doxycycline (VIBRA-TABS) 100 MG tablet, Take 1 tablet (100 mg total) by mouth 2 (two) times daily., Disp: 28 tablet, Rfl: 0   estradiol (ESTRACE) 0.1 MG/GM vaginal cream, Place 1 Applicatorful vaginally 2 (two) times a week., Disp: 42.5 g, Rfl: 0   ibuprofen (ADVIL) 800 MG tablet, Take 1 tablet (800 mg total) by mouth every 6 (six) hours as needed., Disp: 60 tablet, Rfl: 1   oxyCODONE-acetaminophen (PERCOCET) 5-325 MG tablet, Take 1 tablet by mouth every 4 (four) hours as needed for severe pain., Disp: 30 tablet, Rfl: 0   penciclovir (DENAVIR) 1 % cream, Apply topically every 3 (three) hours while awake. For 4 days, Disp: 5 g, Rfl: 0   valACYclovir (VALTREX) 1000 MG tablet, Take 2 tablets (2,000 mg total) by mouth 2 (two) times daily. For one day, Disp: 15 tablet, Rfl: 0  Social History   Tobacco Use  Smoking Status Never  Smokeless Tobacco Never    No Known Allergies Objective:  There  were no vitals filed for this visit. There is no height or weight on file to calculate BMI. Constitutional Well developed. Well nourished.  Vascular Foot warm and well perfused. Capillary refill normal to all digits.   Neurologic Normal speech. Oriented to person, place, and time. Epicritic sensation to light touch grossly present bilaterally.  Dermatologic Skin completely epithelialized.  No signs of dehiscence or infection noted.  Good correction alignment noted reduction of fourth and fifth hammertoe noted  Orthopedic: Tenderness to palpation noted about the surgical site.   Radiographs: None Assessment:   1. S/P foot surgery   2. Hammer toe of left foot   3. Joint contracture of foot, left    Plan:  Patient was evaluated and treated and all questions answered.  S/p foot surgery left -Progressing as expected post-operatively. -XR: See above -WB Status: Weightbearing as tolerated in surgical shoe -Sutures: Removed.  No signs of dehiscence noted no complication noted. -Medications: None -Pain was self removed.  No complication noted correction is being held.  At this time if any foot and ankle issues arise in future advised her to come back and see me.  She is overall pleased with surgery.  No follow-ups on file.

## 2022-06-11 ENCOUNTER — Other Ambulatory Visit: Payer: Self-pay | Admitting: Podiatry

## 2022-06-11 DIAGNOSIS — M24575 Contracture, left foot: Secondary | ICD-10-CM

## 2022-06-11 DIAGNOSIS — M2042 Other hammer toe(s) (acquired), left foot: Secondary | ICD-10-CM

## 2022-06-11 DIAGNOSIS — Z9889 Other specified postprocedural states: Secondary | ICD-10-CM

## 2022-06-20 ENCOUNTER — Ambulatory Visit: Payer: BC Managed Care – PPO

## 2022-06-25 ENCOUNTER — Encounter: Payer: Self-pay | Admitting: Family Medicine

## 2022-06-25 ENCOUNTER — Ambulatory Visit (INDEPENDENT_AMBULATORY_CARE_PROVIDER_SITE_OTHER): Payer: BC Managed Care – PPO | Admitting: Family Medicine

## 2022-06-25 VITALS — BP 140/80 | HR 68 | Temp 100.2°F | Ht 65.0 in | Wt 177.2 lb

## 2022-06-25 DIAGNOSIS — R509 Fever, unspecified: Secondary | ICD-10-CM | POA: Diagnosis not present

## 2022-06-25 LAB — POC COVID19 BINAXNOW: SARS Coronavirus 2 Ag: NEGATIVE

## 2022-06-25 LAB — POC INFLUENZA A&B (BINAX/QUICKVUE)
Influenza A, POC: NEGATIVE
Influenza B, POC: NEGATIVE

## 2022-06-25 MED ORDER — AZITHROMYCIN 250 MG PO TABS: ORAL_TABLET | ORAL | 0 refills | Status: DC

## 2022-06-25 MED ORDER — GUAIFENESIN-CODEINE 100-10 MG/5ML PO SYRP: 5.0000 mL | ORAL_SOLUTION | Freq: Every evening | ORAL | 0 refills | Status: DC | PRN

## 2022-06-25 NOTE — Assessment & Plan Note (Signed)
Acute, negative flu and COVID test. Concerning for initial viral illness now with new bacterial infection given more recent fever after 7 to 10 days of illness. Will treat with azithromycin x 5 days.  She will use Mucinex DM daily for cough and cough suppressant prescription sent in for cough at night. Return and ER precautions provided.

## 2022-06-25 NOTE — Progress Notes (Signed)
Patient ID: Jennifer Soto, female    DOB: [birth year], 61 y.o.   MRN: [id number]  This visit was conducted in person.  BP (!) 140/80   Pulse 68   Temp 100.2 F (37.9 C) (Oral)   Ht 5\' 5"  (1.651 m)   Wt 177 lb 4 oz (80.4 kg)   SpO2 96%   BMI 29.50 kg/m    CC:  Chief Complaint  Patient presents with   Cough    Lingering cough from a cold x 1 1/2 week    Subjective:   HPI: Jennifer Soto is a 61 y.o. female presenting on 06/25/2022 for Cough (Lingering cough from a cold x 1 1/2 week)    Date of onset:  1 week Initial symptoms included  ST, burning in eyes Symptoms progressed to dry cough, Some wheeze, no SOB.  Coughing fits worse lying down.  Temperature 100.2 F, no chills.  Very fatigue and tired.   Sick contacts:  church COVID testing:    none     She has tried to treat with  OTC meds without much relief, Nyquil and Dayquil.     No history of chronic lung disease such as asthma or COPD. Non-smoker.        Relevant past medical, surgical, family and social history reviewed and updated as indicated. Interim medical history since our last visit reviewed. Allergies and medications reviewed and updated. Outpatient Medications Prior to Visit  Medication Sig Dispense Refill   citalopram (CELEXA) 40 MG tablet Take 1 tablet (40 mg total) by mouth daily. 90 tablet 1   estradiol (ESTRACE) 0.1 MG/GM vaginal cream Place 1 Applicatorful vaginally 2 (two) times a week. 42.5 g 0   ibuprofen (ADVIL) 800 MG tablet Take 1 tablet (800 mg total) by mouth every 6 (six) hours as needed. 60 tablet 1   penciclovir (DENAVIR) 1 % cream Apply topically every 3 (three) hours while awake. For 4 days 5 g 0   valACYclovir (VALTREX) 1000 MG tablet Take 2 tablets (2,000 mg total) by mouth 2 (two) times daily. For one day 15 tablet 0   oxyCODONE-acetaminophen (PERCOCET) 5-325 MG tablet Take 1 tablet by mouth every 4 (four) hours as needed for severe pain. 30 tablet 0   doxycycline  (VIBRA-TABS) 100 MG tablet Take 1 tablet (100 mg total) by mouth 2 (two) times daily. 28 tablet 0   No facility-administered medications prior to visit.     Per HPI unless specifically indicated in ROS section below Review of Systems  Constitutional:  Negative for fatigue and fever.  HENT:  Negative for congestion.   Eyes:  Negative for pain.  Respiratory:  Positive for cough and wheezing. Negative for shortness of breath.   Cardiovascular:  Negative for chest pain, palpitations and leg swelling.  Gastrointestinal:  Negative for abdominal pain.  Genitourinary:  Negative for dysuria and vaginal bleeding.  Musculoskeletal:  Negative for back pain.  Neurological:  Negative for syncope, light-headedness and headaches.  Psychiatric/Behavioral:  Negative for dysphoric mood.    Objective:  BP (!) 140/80   Pulse 68   Temp 100.2 F (37.9 C) (Oral)   Ht 5\' 5"  (1.651 m)   Wt 177 lb 4 oz (80.4 kg)   SpO2 96%   BMI 29.50 kg/m   Wt Readings from Last 3 Encounters:  06/25/22 177 lb 4 oz (80.4 kg)  04/22/22 173 lb (78.5 kg)      Physical Exam Constitutional:  General: She is not in acute distress.    Appearance: She is well-developed. She is not ill-appearing or toxic-appearing.  HENT:     Head: Normocephalic.     Right Ear: Hearing, tympanic membrane, ear canal and external ear normal. Tympanic membrane is not erythematous, retracted or bulging.     Left Ear: Hearing, tympanic membrane, ear canal and external ear normal. Tympanic membrane is not erythematous, retracted or bulging.     Nose: Mucosal edema and rhinorrhea present.     Right Sinus: No maxillary sinus tenderness or frontal sinus tenderness.     Left Sinus: No maxillary sinus tenderness or frontal sinus tenderness.     Mouth/Throat:     Pharynx: Uvula midline.  Eyes:     General: Lids are normal. Lids are everted, no foreign bodies appreciated.     Conjunctiva/sclera: Conjunctivae normal.     Pupils: Pupils are equal,  round, and reactive to light.  Neck:     Thyroid: No thyroid mass or thyromegaly.     Vascular: No carotid bruit.     Trachea: Trachea normal.  Cardiovascular:     Rate and Rhythm: Normal rate and regular rhythm.     Pulses: Normal pulses.     Heart sounds: Normal heart sounds, S1 normal and S2 normal. No murmur heard.    No friction rub. No gallop.  Pulmonary:     Effort: Pulmonary effort is normal. No tachypnea or respiratory distress.     Breath sounds: Normal breath sounds. No decreased breath sounds, wheezing, rhonchi or rales.  Musculoskeletal:     Cervical back: Normal range of motion and neck supple.  Skin:    General: Skin is warm and dry.     Findings: No rash.  Neurological:     Mental Status: She is alert.  Psychiatric:        Mood and Affect: Mood is not anxious or depressed.        Speech: Speech normal.        Behavior: Behavior normal. Behavior is cooperative.        Judgment: Judgment normal.       Results for orders placed or performed in visit on 05/24/22  [facility] MAMMOGRAPHY  Result Value Ref Range   [facility] Mammogram 0-4 Bi-Rad 0-4 Bi-Rad, Self Reported Normal  [facility] PAP SMEAR  Result Value Ref Range   [facility] Pap smear negative   Results Console HPV  Result Value Ref Range   [facility] HPV Negative      COVID 19 screen:  No recent travel or known exposure to COVID19 The patient denies respiratory symptoms of COVID 19 at this time. The importance of social distancing was discussed today.   Assessment and Plan Problem List Items Addressed This Visit     Fever - Primary    Acute, negative flu and COVID test. Concerning for initial viral illness now with new bacterial infection given more recent fever after 7 to 10 days of illness. Will treat with azithromycin x 5 days.  She will use Mucinex DM daily for cough and cough suppressant prescription sent in for cough at night. Return and ER precautions provided.      Relevant Orders   POC Influenza A&B (Binax test)  (Completed)   POC COVID-19 (Completed)   Meds ordered this encounter  Medications   azithromycin (ZITHROMAX) 250 MG tablet    Sig: 2 tab po x 1 day then 1 tab po daily    Dispense:  6  tablet    Refill:  0   guaiFENesin-codeine (ROBITUSSIN AC) 100-10 MG/5ML syrup    Sig: Take 5-10 mLs by mouth at bedtime as needed for cough.    Dispense:  180 mL    Refill:  0       Kerby Nora, MD

## 2022-11-11 ENCOUNTER — Ambulatory Visit: Payer: BC Managed Care – PPO | Admitting: Nurse Practitioner

## 2022-11-11 ENCOUNTER — Encounter: Payer: Self-pay | Admitting: Nurse Practitioner

## 2022-11-11 ENCOUNTER — Ambulatory Visit (INDEPENDENT_AMBULATORY_CARE_PROVIDER_SITE_OTHER)
Admission: RE | Admit: 2022-11-11 | Discharge: 2022-11-11 | Disposition: A | Discharge status: Home / Self Care | Payer: BC Managed Care – PPO | Source: Ambulatory Visit | Attending: Nurse Practitioner | Admitting: Nurse Practitioner

## 2022-11-11 VITALS — BP 138/66 | HR 68 | Temp 97.6°F | Resp 16 | Ht 65.0 in | Wt 177.5 lb

## 2022-11-11 DIAGNOSIS — R053 Chronic cough: Secondary | ICD-10-CM | POA: Diagnosis not present

## 2022-11-11 DIAGNOSIS — R0982 Postnasal drip: Secondary | ICD-10-CM | POA: Insufficient documentation

## 2022-11-11 MED ORDER — FLUTICASONE PROPIONATE 50 MCG/ACT NA SUSP: 2.0000 | Freq: Every day | NASAL | 0 refills | Status: DC

## 2022-11-11 MED ORDER — PANTOPRAZOLE SODIUM 40 MG PO TBEC: 40.0000 mg | DELAYED_RELEASE_TABLET | Freq: Every day | ORAL | 0 refills | Status: DC

## 2022-11-11 NOTE — Assessment & Plan Note (Signed)
Over a year at night.  No sick symptoms.  Will get chest x-ray change omeprazole 20 mg to Protonix 40 mg for 1 month.  Continue over-the-counter antihistamine and Flonase nasal spray follow-up 3 to 4 weeks if no improvement

## 2022-11-11 NOTE — Patient Instructions (Signed)
Nice to see you today You can use the nasal saline twice a day I have sent in some flonase and a pill for reflux, discontinue the omeprazole.  Follow up with me in 3-4 weeks if not improving

## 2022-11-11 NOTE — Assessment & Plan Note (Signed)
Patient has possible postnasal drip with clearing of her throat will start on fluticasone nasal spray as directed can using nasal saline twice a day to prevent nosebleeds.  Also continue over-the-counter store brand antihistamine

## 2022-11-11 NOTE — Progress Notes (Signed)
Acute Office Visit  Subjective:     Patient ID: Jennifer Soto, female    DOB: 1960/12/05, 62 y.o.   MRN: [id number]  Chief Complaint  Patient presents with   Cough    Coughing at night over a year.     Patient is in today for cough with a non contributory history without a history of smoking   States that she has seen a ENT in the past and did not have an polyps and thought it was reflux and was recommended that she do omeprazole 20mg  and has been on it for approx 6 months, without relief. States that it is at night and and she has to clear her throat often.  No increased burping or gas  States she will eat around 6p and go to bed around 10p. States that she has tried an otc nasal spray that has not helped. She is also on a store brand allergy pill  States that she has used cough drops and syrup with out relief. Cough drops help for just a short period   Review of Systems  Constitutional:  Negative for chills and fever.  Respiratory:  Positive for cough. Negative for shortness of breath.   Cardiovascular:  Negative for chest pain.  Gastrointestinal:  Negative for abdominal pain, constipation, diarrhea, nausea and vomiting.  Neurological:  Negative for headaches.  Psychiatric/Behavioral:  Negative for hallucinations and suicidal ideas.         Objective:    BP 138/66   Pulse 68   Temp 97.6 F (36.4 C)   Resp 16   Ht 5\' 5"  (1.651 m)   Wt 177 lb 8 oz (80.5 kg)   SpO2 96%   BMI 29.54 kg/m  BP Readings from Last 3 Encounters:  11/11/22 138/66  06/25/22 (!) 140/80  04/22/22 136/76   Wt Readings from Last 3 Encounters:  11/11/22 177 lb 8 oz (80.5 kg)  06/25/22 177 lb 4 oz (80.4 kg)  04/22/22 173 lb (78.5 kg)      Physical Exam Vitals and nursing note reviewed.  Constitutional:      Appearance: Normal appearance.  HENT:     Right Ear: Tympanic membrane, ear canal and external ear normal.     Left Ear: Tympanic membrane, ear canal and external ear normal.      Mouth/Throat:     Mouth: Mucous membranes are moist.     Pharynx: Oropharynx is clear.  Cardiovascular:     Rate and Rhythm: Normal rate and regular rhythm.     Heart sounds: Normal heart sounds.  Pulmonary:     Effort: Pulmonary effort is normal.     Breath sounds: Normal breath sounds.  Abdominal:     General: Bowel sounds are normal. There is no distension.     Palpations: Abdomen is soft. There is no mass.     Tenderness: There is abdominal tenderness.     Hernia: No hernia is present.    Lymphadenopathy:     Cervical: No cervical adenopathy.  Neurological:     Mental Status: She is alert.     No results found for any visits on 11/11/22.      Assessment & Plan:   Problem List Items Addressed This Visit       Other   Chronic cough - Primary    Over a year at night.  No sick symptoms.  Will get chest x-ray change omeprazole 20 mg to Protonix 40 mg for 1 month.  Continue over-the-counter antihistamine and Flonase nasal spray follow-up 3 to 4 weeks if no improvement      Relevant Medications   fluticasone (FLONASE) 50 MCG/ACT nasal spray   pantoprazole (PROTONIX) 40 MG tablet   Other Relevant Orders   DG Chest 2 View   PND (post-nasal drip)    Patient has possible postnasal drip with clearing of her throat will start on fluticasone nasal spray as directed can using nasal saline twice a day to prevent nosebleeds.  Also continue over-the-counter store brand antihistamine      Relevant Medications   fluticasone (FLONASE) 50 MCG/ACT nasal spray    Meds ordered this encounter  Medications   fluticasone (FLONASE) 50 MCG/ACT nasal spray    Sig: Place 2 sprays into both nostrils daily.    Dispense:  16 g    Refill:  0    Order Specific Question:   Supervising Provider    Answer:   Roxy Manns A [[id number]]   pantoprazole (PROTONIX) 40 MG tablet    Sig: Take 1 tablet (40 mg total) by mouth daily.    Dispense:  30 tablet    Refill:  0    Order Specific Question:    Supervising Provider    Answer:   TOWER, MARNE A [[id number]    Return if symptoms worsen or fail to improve.  Audria Nine, NP

## 2022-11-19 ENCOUNTER — Telehealth: Payer: Self-pay | Admitting: Nurse Practitioner

## 2022-11-19 DIAGNOSIS — R053 Chronic cough: Secondary | ICD-10-CM

## 2022-11-19 MED ORDER — PANTOPRAZOLE SODIUM 40 MG PO TBEC: 40.0000 mg | DELAYED_RELEASE_TABLET | Freq: Two times a day (BID) | ORAL | 0 refills | Status: DC

## 2022-11-19 NOTE — Telephone Encounter (Signed)
Pt called wants to know can she take 2 tablets of RXpantoprazole (PROTONIX) 40 MG tablet  stated 1 tablet is not working . Please advise [phone]

## 2022-11-19 NOTE — Telephone Encounter (Signed)
It is ok to take protonix 40mg  one tablet twice a day

## 2022-11-19 NOTE — Telephone Encounter (Signed)
Called and informed pt of this information. 

## 2022-11-21 ENCOUNTER — Other Ambulatory Visit: Payer: Self-pay | Admitting: Nurse Practitioner

## 2022-11-21 DIAGNOSIS — N941 Unspecified dyspareunia: Secondary | ICD-10-CM

## 2022-11-21 DIAGNOSIS — R053 Chronic cough: Secondary | ICD-10-CM

## 2022-11-25 ENCOUNTER — Other Ambulatory Visit: Payer: Self-pay | Admitting: Nurse Practitioner

## 2022-11-25 DIAGNOSIS — R053 Chronic cough: Secondary | ICD-10-CM

## 2022-12-01 ENCOUNTER — Other Ambulatory Visit: Payer: Self-pay | Admitting: Nurse Practitioner

## 2022-12-01 DIAGNOSIS — R053 Chronic cough: Secondary | ICD-10-CM

## 2022-12-01 DIAGNOSIS — Z8619 Personal history of other infectious and parasitic diseases: Secondary | ICD-10-CM

## 2022-12-02 ENCOUNTER — Encounter: Payer: Self-pay | Admitting: Nurse Practitioner

## 2022-12-02 DIAGNOSIS — R053 Chronic cough: Secondary | ICD-10-CM

## 2022-12-02 MED ORDER — PANTOPRAZOLE SODIUM 40 MG PO TBEC
40.0000 mg | DELAYED_RELEASE_TABLET | Freq: Two times a day (BID) | ORAL | 0 refills | Status: DC
Start: 2022-12-02 — End: 2022-12-03

## 2022-12-02 MED ORDER — VALACYCLOVIR HCL 1 G PO TABS
2000.0000 mg | ORAL_TABLET | Freq: Two times a day (BID) | ORAL | 0 refills | Status: DC
Start: 2022-12-02 — End: 2024-02-05

## 2022-12-02 MED ORDER — PENCICLOVIR 1 % EX CREA
TOPICAL_CREAM | CUTANEOUS | 0 refills | Status: AC
Start: 2022-12-02 — End: ?

## 2022-12-03 MED ORDER — PANTOPRAZOLE SODIUM 40 MG PO TBEC
40.0000 mg | DELAYED_RELEASE_TABLET | Freq: Two times a day (BID) | ORAL | 0 refills | Status: DC
Start: 2022-12-03 — End: 2022-12-11

## 2022-12-06 ENCOUNTER — Other Ambulatory Visit: Payer: Self-pay | Admitting: Nurse Practitioner

## 2022-12-06 DIAGNOSIS — R053 Chronic cough: Secondary | ICD-10-CM

## 2022-12-08 ENCOUNTER — Other Ambulatory Visit: Payer: Self-pay | Admitting: Nurse Practitioner

## 2022-12-08 DIAGNOSIS — R053 Chronic cough: Secondary | ICD-10-CM

## 2022-12-08 DIAGNOSIS — R0982 Postnasal drip: Secondary | ICD-10-CM

## 2022-12-11 ENCOUNTER — Other Ambulatory Visit: Payer: Self-pay | Admitting: Nurse Practitioner

## 2022-12-11 ENCOUNTER — Telehealth: Payer: Self-pay | Admitting: Nurse Practitioner

## 2022-12-11 DIAGNOSIS — R053 Chronic cough: Secondary | ICD-10-CM

## 2022-12-11 MED ORDER — PANTOPRAZOLE SODIUM 40 MG PO TBEC
40.0000 mg | DELAYED_RELEASE_TABLET | Freq: Two times a day (BID) | ORAL | 0 refills | Status: DC
Start: 2022-12-11 — End: 2023-01-08

## 2022-12-11 NOTE — Telephone Encounter (Signed)
Pt called stating she was told by her pharmacy that they still haven't received a rx for pantoprazole (PROTONIX) 40 MG tablet. Call back # [phone]

## 2022-12-11 NOTE — Telephone Encounter (Signed)
I looked at her chart and the RX was sent to no print.

## 2022-12-11 NOTE — Addendum Note (Signed)
Addended by: Eden Emms on: 12/11/2022 04:50 PM   Modules accepted: Orders

## 2022-12-11 NOTE — Telephone Encounter (Signed)
Medication sent in. 

## 2023-01-08 ENCOUNTER — Other Ambulatory Visit: Payer: Self-pay | Admitting: Nurse Practitioner

## 2023-01-08 ENCOUNTER — Encounter: Payer: Self-pay | Admitting: Nurse Practitioner

## 2023-01-08 DIAGNOSIS — R053 Chronic cough: Secondary | ICD-10-CM

## 2023-01-08 MED ORDER — PANTOPRAZOLE SODIUM 40 MG PO TBEC
40.0000 mg | DELAYED_RELEASE_TABLET | Freq: Every day | ORAL | 1 refills | Status: DC
Start: 2023-01-08 — End: 2023-06-30

## 2023-01-17 ENCOUNTER — Encounter: Payer: Self-pay | Admitting: Gastroenterology

## 2023-01-17 NOTE — Addendum Note (Signed)
Addended by: Melina Copa on: 01/17/2023 08:49 AM   Modules accepted: Orders

## 2023-01-17 NOTE — Telephone Encounter (Signed)
Pending referral request per MyChart message.

## 2023-01-17 NOTE — Addendum Note (Signed)
Addended by: Eden Emms on: 01/17/2023 01:47 PM   Modules accepted: Orders

## 2023-02-18 ENCOUNTER — Other Ambulatory Visit: Payer: Self-pay | Admitting: Nurse Practitioner

## 2023-02-18 DIAGNOSIS — F411 Generalized anxiety disorder: Secondary | ICD-10-CM

## 2023-02-18 NOTE — Telephone Encounter (Signed)
LAST APPOINTMENT DATE: 01/08/2023   NEXT APPOINTMENT DATE: Visit date not found    LAST REFILL:  QTY:

## 2023-02-19 NOTE — Telephone Encounter (Signed)
Can we schedule patietn for a cpe on 04/24/2023 or after. Any 20 min slot will work

## 2023-02-19 NOTE — Telephone Encounter (Signed)
Left voicemail for patient to call the office back.   

## 2023-02-19 NOTE — Telephone Encounter (Signed)
Spoke to pt, scheduled pt's cpe for 05/09/23

## 2023-03-05 ENCOUNTER — Ambulatory Visit: Payer: BC Managed Care – PPO | Admitting: Gastroenterology

## 2023-03-05 ENCOUNTER — Telehealth: Payer: Self-pay | Admitting: Gastroenterology

## 2023-03-05 NOTE — Telephone Encounter (Signed)
PT's appointment was at 1010am today and she just called and said that she was 5 minutes away. I advised her of the 15 minute late policy. She said she was still coming.

## 2023-05-09 ENCOUNTER — Encounter: Payer: BC Managed Care – PPO | Admitting: Nurse Practitioner

## 2023-05-20 ENCOUNTER — Encounter: Payer: Self-pay | Admitting: Gastroenterology

## 2023-05-20 ENCOUNTER — Telehealth: Payer: Self-pay

## 2023-05-20 ENCOUNTER — Ambulatory Visit: Payer: BC Managed Care – PPO | Admitting: Gastroenterology

## 2023-05-20 VITALS — BP 126/80 | HR 65 | Ht 65.0 in | Wt 177.0 lb

## 2023-05-20 DIAGNOSIS — R053 Chronic cough: Secondary | ICD-10-CM | POA: Diagnosis not present

## 2023-05-20 NOTE — Telephone Encounter (Signed)
Called and left voicemail to confirm if patient is currently using pantoprazole.

## 2023-05-20 NOTE — Patient Instructions (Addendum)
PREPARATION FOR ENDOSCOPY with Bravo  On 07/02/2023 THE DAY OF THE PROCEDURE:  1. No solid foods, milk or milk products are allowed after midnight the night before your procedure.  2. Do not drink anything colored red or purple. Avoid juices with pulp. No orange juice.  3. You may drink clear liquids until 7:00am, which is 3 hours before your procedure.   You should not have any gum, water, medicine or candy after this 3 hour stop time.   Clear liquids include:NO RED & NO PURPLE  Water Jello  Ice Popsicles  Tea (sugar ok, no milk/cream) Powdered fruit flavored drinks  Coffee (sugar ok, no milk/cream) Gatorade  Juice: apple, white grape, white cranberry Lemonade, Kool-Aid  Clear bullion,broth (vegetable,chicken,beef) Carbonated beverages (any kind)  Strained chicken noodle soup  (no noodles or chicken) Hard Candy   Stop drinking  all liquids including water  at 7:00am , no gum, candy or medications after 7:00am  _______________________________________________________________________________   Additional instructions for the Bravo pH monitoring:  1. Stop all PPI therapy for at least 7 days prior to your procedure/pH monitoring:  omeprazole (Prilosec, Prilosec OTC, Zegerid); lansoprazole (Prevacid); pantoprazole (Protonix);  rabeprazole (Aciphex); esomeprazole (Nexium); dexlansoprazole (Dexilant)  2. Stop H2 blockers for 48 hours prior to procedure (Pepcid; Pepcid AC; Tagamet; Axid; Zantac)  3. Remain off antacids for 12 hours prior to your procedure  4. Do not use chewing tobacco or smoke after 12 midnight prior to your scheduled procedure  You will receive a monitoring device the day of your procedure and will be instructed on its use. Plan to return the device to our office after 48 hours so your provider will be able to interpret your data.  *Contraindications: bleeding diathesis, strictures, severe esophagitis, varices, history of bowel obstructions, pacemaker,  implantable cardiac defibrillator - (Bravo pH test not indicated)  VIDEO for Bravo pH capsule patient education:  BravoT Patient Training Cartoon - YouTube  Reminder: NO MRI scans for 30 days after your Bravo pH capsule placement    You have been scheduled for an endoscopy. Please follow written instructions given to you at your visit today.  If you use inhalers (even only as needed), please bring them with you on the day of your procedure.  If you take any of the following medications, they will need to be adjusted prior to your procedure:   DO NOT TAKE 7 DAYS PRIOR TO TEST- Trulicity (dulaglutide) Ozempic, Wegovy (semaglutide) Mounjaro (tirzepatide) Bydureon Bcise (exanatide extended release)  DO NOT TAKE 1 DAY PRIOR TO YOUR TEST Rybelsus (semaglutide) Adlyxin (lixisenatide) Victoza (liraglutide) Byetta (exanatide) ___________________________________________________________________________    _______________________________________________________  If your blood pressure at your visit was 140/90 or greater, please contact your primary care physician to follow up on this.  _______________________________________________________  If you are age 62 or older, your body mass index should be between 23-30. Your Body mass index is 29.45 kg/m. If this is out of the aforementioned range listed, please consider follow up with your Primary Care Provider.  If you are age 5 or younger, your body mass index should be between 19-25. Your Body mass index is 29.45 kg/m. If this is out of the aformentioned range listed, please consider follow up with your Primary Care Provider.   ________________________________________________________  The [facility] providers would like to encourage you to use [facility] to communicate with providers for non-urgent requests or questions.  Due to long hold times on the telephone, sending your provider a message by [facility] may  be a faster and more efficient  way to get a response.  Please allow 48 business hours for a response.  Please remember that this is for non-urgent requests.  _______________________________________________________ It was a pleasure to see you today!  Thank you for trusting me with your gastrointestinal care!

## 2023-05-20 NOTE — Progress Notes (Signed)
[facility] Consult Note:  History: Jennifer Soto 05/20/2023  Referring provider: Eden Emms, NP  Reason for consult/chief complaint: Cough (Pt states she has a chronic cough, pt states that  the cough is worst when she lay down, pt stats the cough hurts her chest, pt been having problems with coughing for years)   Subjective  Prior history:  This is a 62 year old woman referred to [place] by primary care for evaluation of chronic cough. Chart review indicates she was seen by a PA at a local ENT practice September 2022 for this problem, and diagnosed with LPR.  From that note: "Jennifer Soto presents today with complaint of postnasal drainage and sore throat. She states she has had this a couple of years. She describes it as the feeling of drainage in her throat, frequent throat clearing, some cough which is nonproductive. She endorses occasional hoarseness. She has tried some OTC antihistamines without much relief. She states she has never been allergy tested. She does admit to some heartburn in the past. She is not experiencing rhinorrhea or itchy, watery eyes. " Direct laryngoscopy was normal except for some mild arytenoid erythema.  From PCP visit earlier this year: "Patient is in today for cough with a non contributory history without a history of smoking    States that she has seen a ENT in the past and did not have an polyps and thought it was reflux and was recommended that she do omeprazole 20mg  and has been on it for approx 6 months, without relief. States that it is at night and and she has to clear her throat often.  No increased burping or gas   States she will eat around 6p and go to bed around 10p. States that she has tried an otc nasal spray that has not helped. She is also on a store brand allergy pill  States that she has used cough drops and syrup with out relief. Cough drops help for just a short period "    Discussed the use of AI scribe software  for clinical note transcription with the patient, who gave verbal consent to proceed.  History of Present Illness   The patient, with a history of chronic cough for a couple of years, presents with worsening symptoms when lying down, particularly when laughing. The cough is described as severe enough to cause breathlessness, requiring assistance to sit up. The patient also reports a dry nose in the morning and occasional phlegm production. The cough was initially diagnosed as acid reflux by an ENT specialist, and the patient was prescribed PPI which did not lead to any improvement.  The cough has persisted for about 3 years now despite over-the-counter antacid treatment.  (See above primary care note for some summary) the patient denies experiencing heartburn or regurgitation, but reports occasional constipation managed with Exlax. The patient has not undergone any allergy testing or seen a pulmonologist for this issue.       ROS:  Review of Systems  Constitutional:  Negative for appetite change and unexpected weight change.  HENT:  Negative for mouth sores and voice change.   Eyes:  Negative for pain and redness.  Respiratory:  Positive for cough and shortness of breath.   Cardiovascular:  Negative for chest pain and palpitations.  Genitourinary:  Negative for dysuria and hematuria.  Musculoskeletal:  Negative for arthralgias and myalgias.  Skin:  Negative for pallor and rash.  Neurological:  Negative for weakness and headaches.  Hematological:  Negative for adenopathy.     Past Medical History: Past Medical History:  Diagnosis Date   Anxiety    Cold sore    Reports screening colonoscopy few yrs ago with another local practice-no report found in the chart and she cannot recall which practice it was.  Past Surgical History: Past Surgical History:  Procedure Laterality Date   ANKLE ARTHROPLASTY Left 2007   CESAREAN SECTION     x2   TOE SURGERY Left 2018     Family  History: Family History  Problem Relation Age of Onset   Thyroid disease Mother    Arrhythmia Father        afib    Social History: Social History   Socioeconomic History   Marital status: Married    Spouse name: Jennifer Soto   Number of children: 2   Years of education: Not on file   Highest education level: Associate degree: occupational, Scientist, product/process development, or vocational program  Occupational History   Occupation: retired  Tobacco Use   Smoking status: Never   Smokeless tobacco: Never  Vaping Use   Vaping status: Never Used  Substance and Sexual Activity   Alcohol use: Not Currently    Alcohol/week: 1.0 standard drink of alcohol    Types: 1 Glasses of wine per week   Drug use: Never   Sexual activity: Yes    Birth control/protection: Post-menopausal  Other Topics Concern   Not on file  Social History Narrative   Jennifer Soto,35 Jennifer Soto 58      Retired: worked for Estate manager/land agent at [employer]: Low Risk  (11/08/2022)   Overall Soto Resource Strain (CARDIA)    Difficulty of Paying Living Expenses: Not hard at all  Food Insecurity: No Food Insecurity (11/08/2022)   Hunger Vital Sign    Worried About Running Out of Food in the Last Year: Never true    Ran Out of Food in the Last Year: Never true  Transportation Needs: No Transportation Needs (11/08/2022)   PRAPARE - Administrator, Civil Service (Medical): No    Lack of Transportation (Non-Medical): No  Physical Activity: Unknown (11/08/2022)   Exercise Vital Sign    Days of Exercise per Week: 2 days    Minutes of Exercise per Session: Patient declined  Stress: No Stress Concern Present (11/08/2022)   Jennifer Soto - Occupational Stress Questionnaire    Feeling of Stress : Not at all  Social Connections: Socially Integrated (11/08/2022)   Social Connection and Isolation Panel [NHANES]    Frequency of Communication with Friends  and Family: Three times a week    Frequency of Social Gatherings with Friends and Family: Once a week    Attends Religious Services: More than 4 times per year    Active Member of Jennifer Soto or Organizations: Yes    Attends Engineer, structural: More than 4 times per year    Marital Status: Married    Allergies: No Known Allergies  Outpatient Meds: Current Outpatient Medications  Medication Sig Dispense Refill   citalopram (CELEXA) 40 MG tablet TAKE 1 TABLET BY MOUTH EVERY DAY 90 tablet 0   estradiol (ESTRACE) 0.1 MG/GM vaginal cream PLACE 1 APPLICATORFUL VAGINALLY 2 (TWO) TIMES A WEEK. 42.5 g 0   OVER THE COUNTER MEDICATION Pt using a nasal spray     pantoprazole (PROTONIX) 40 MG tablet Take 1 tablet (40 mg total) by mouth daily.  90 tablet 1   penciclovir (DENAVIR) 1 % cream Apply topically every 3 (three) hours while awake. For 4 days 5 g 0   valACYclovir (VALTREX) 1000 MG tablet Take 2 tablets (2,000 mg total) by mouth 2 (two) times daily. For one day 15 tablet 0   fluticasone (FLONASE) 50 MCG/ACT nasal spray Place 2 sprays into both nostrils daily. (Patient not taking: Reported on 05/20/2023) 16 g 0   No current facility-administered medications for this visit.      ___________________________________________________________________ Objective   Exam:  BP 126/80   Pulse 65   Ht 5\' 5"  (1.651 m)   Wt 177 lb (80.3 kg)   BMI 29.45 kg/m  Wt Readings from Last 3 Encounters:  05/20/23 177 lb (80.3 kg)  11/11/22 177 lb 8 oz (80.5 kg)  06/25/22 177 lb 4 oz (80.4 kg)    General: Somewhat gravelly vocal quality.  She frequently clears her throat during the visit Eyes: sclera anicteric, no redness ENT: oral mucosa moist without lesions, no cervical or supraclavicular lymphadenopathy CV: Regular without appreciable murmur, no JVD, no peripheral edema Resp: clear to auscultation bilaterally, normal RR and effort noted GI: soft, no tenderness, with active bowel sounds. No  guarding or palpable organomegaly noted. Skin; warm and dry, no rash or jaundice noted Neuro: awake, alert and oriented x 3. Normal gross motor function and fluent speech   Labs:  Narrative & Impression  CLINICAL DATA:  62 year old female with cough   EXAM: CHEST - 2 VIEW   COMPARISON:  01/06/2006   FINDINGS: Cardiomediastinal silhouette unchanged in size and contour. No evidence of central vascular congestion. No interlobular septal thickening.   No pneumothorax or pleural effusion. Coarsened interstitial markings, with no confluent airspace disease.   No acute displaced fracture. Degenerative changes of the spine.   IMPRESSION: No active cardiopulmonary disease.     Electronically Signed   By: Gilmer Mor D.O.   On: 11/13/2022 10:39    Assessment: Encounter Diagnosis  Name Primary?   Chronic cough Yes    Assessment and Plan    Chronic Cough Persistent for a couple of years, exacerbated by laying down and laughing. Previous diagnosis of acid reflux by ENT, but symptoms persist despite antacid use. Possible postnasal drip contributing to symptoms.  No pre-existing heartburn or regurgitation and reportedly little if any improvement on acid suppression makes reflux less likely to be a source of this cough. We discussed the typical difficulty answering the question of to what extent any demonstrable reflux is contributing to her patient's GERD, especially given the complex nature of cough and the frequency of nonpathologic reflux in the general population.  I will say that the nonspecific findings reported on her ENT laryngoscopy do not by themselves make a reflux diagnosis.  -Refer to pulmonologist for further evaluation including pulmonary function testing and possible PFTs and chest CT.  Question cough variant asthma Depending on those results, can then consider utility and evaluation by an allergist.  -Perform upper endoscopy with Bravo pH device placement to  assess for gastroesophageal reflux. Patient to stop acid reducing medications 5 days prior to the procedure. (Patient is listed as being on pantoprazole at this time, but says she is not taking it) She is taking some kind of a over-the-counter antacid chew, and we have asked her to let us know what this is to know if it is a calcium carbonate or similar product.  This will affect which medicines need to be held prior  to the EGD with pH study.  Constipation Occasional, managed with Exlax. -Continue current management as needed.  General Soto Maintenance -Confirmed up-to-date colon cancer screening with colonoscopy performed a few years prior.        Thank you for the courtesy of this consult.  Please call me with any questions or concerns.  Charlie Pitter III  CC: Referring provider noted above

## 2023-05-20 NOTE — Telephone Encounter (Signed)
Patient return your call and confirmed she is still using pantoprazole.

## 2023-06-21 LAB — HM MAMMOGRAPHY

## 2023-06-23 ENCOUNTER — Encounter: Payer: Self-pay | Admitting: Nurse Practitioner

## 2023-06-30 ENCOUNTER — Other Ambulatory Visit: Payer: Self-pay | Admitting: Nurse Practitioner

## 2023-06-30 DIAGNOSIS — F411 Generalized anxiety disorder: Secondary | ICD-10-CM

## 2023-06-30 DIAGNOSIS — R053 Chronic cough: Secondary | ICD-10-CM

## 2023-06-30 NOTE — Telephone Encounter (Signed)
Patient needs CPE scheduled within the next 30 days to continue getting refills.  Any 20-minute slot will do

## 2023-06-30 NOTE — Telephone Encounter (Signed)
Patient was due for CPE in October. Did not see where one was set up.

## 2023-07-01 NOTE — Telephone Encounter (Signed)
Spoke to pt, pt states she'd call office back to schedule.

## 2023-07-02 ENCOUNTER — Ambulatory Visit: Payer: BC Managed Care – PPO | Admitting: Gastroenterology

## 2023-07-02 ENCOUNTER — Encounter: Payer: Self-pay | Admitting: Gastroenterology

## 2023-07-02 VITALS — BP 139/66 | HR 63 | Temp 97.3°F | Resp 16 | Ht 65.0 in | Wt 177.0 lb

## 2023-07-02 DIAGNOSIS — K449 Diaphragmatic hernia without obstruction or gangrene: Secondary | ICD-10-CM

## 2023-07-02 DIAGNOSIS — R053 Chronic cough: Secondary | ICD-10-CM | POA: Diagnosis present

## 2023-07-02 MED ORDER — SODIUM CHLORIDE 0.9 % IV SOLN: 500.0000 mL | INTRAVENOUS | Status: DC

## 2023-07-02 NOTE — Op Note (Signed)
[facility] Patient Name: Jennifer Soto Procedure Date: 07/02/2023 10:14 AM MRN: [id number] Endoscopist: Sherilyn Cooter L. Myrtie Neither , MD, [id number] Age: 62 Referring MD:  Date of Birth: 1961-06-13 Gender: Female Account #: [id number] Procedure:                Upper GI endoscopy Indications:              Chronic cough (clinical GERD is a potential                            contributing cause of the cough)                           Clinical details in office consult note of 05/20/2023 Medicines:                Monitored Anesthesia Care Procedure:                Pre-Anesthesia Assessment:                           - Prior to the procedure, a History and Physical                            was performed, and patient medications and                            allergies were reviewed. The patient's tolerance of                            previous anesthesia was also reviewed. The risks                            and benefits of the procedure and the sedation                            options and risks were discussed with the patient.                            All questions were answered, and informed consent                            was obtained. Prior Anticoagulants: The patient has                            taken no anticoagulant or antiplatelet agents. ASA                            Grade Assessment: II - A patient with mild systemic                            disease. After reviewing the risks and benefits,                            the patient was deemed in satisfactory condition to  undergo the procedure.                           After obtaining informed consent, the endoscope was                            passed under direct vision. Throughout the                            procedure, the patient's blood pressure, pulse, and                            oxygen saturations were monitored continuously. The                            Olympus Scope [id number]  was introduced through the                            mouth, and advanced to the second part of duodenum.                            The upper GI endoscopy was accomplished without                            difficulty. The patient tolerated the procedure                            poorly. (See below) Scope In: Scope Out: Findings:                 A 2 cm hiatal hernia was present. No esophageal                            mucosal abnormalities including esophagitis seen.                           The stomach was normal. No retained food or liquid.                            Normal distention with insufflation.                           The cardia and gastric fundus were normal on                            retroflexion.                           The examined duodenum was normal.                           Almost immediately after passing the scope into the                            esophagus, the patient developed a coughing fit and  laryngeal spasm which led to oxygen desaturation.                            The endoscope was withdrawn, the patient required                            upper airway support with chin lift/jaw thrust,                            placement of an oral airway and brief                            bag-valve-mask support to break the laryngeal                            spasm. (See anesthesia records)                           The patient recovered well but was still coughing,                            and this precluded the planned placement of the                            Bravo pH device. Complications:            No immediate complications. Estimated Blood Loss:     Estimated blood loss: none. Impression:               - 2 cm hiatal hernia.                           - Normal stomach.                           - Normal examined duodenum.                           - No specimens collected. Recommendation:           - Patient has a  contact number available for                            emergencies. The signs and symptoms of potential                            delayed complications were discussed with the                            patient. Return to normal activities tomorrow.                            Written discharge instructions were provided to the                            patient.                           -  Resume previous diet.                           - Continue present medications.                           - If patient wishes to proceed, schedule standard                            esophageal pH/impedance and manometry testing. Zalea Pete L. Myrtie Neither, MD 07/02/2023 10:55:20 AM This report has been signed electronically.

## 2023-07-02 NOTE — Progress Notes (Signed)
Pt's states no medical or surgical changes since previsit or office visit. 

## 2023-07-02 NOTE — Progress Notes (Signed)
Called to room to assist during endoscopic procedure.  Patient ID and intended procedure confirmed with present staff. Received instructions for my participation in the procedure from the performing physician.  

## 2023-07-02 NOTE — Patient Instructions (Signed)
-   Resume previous diet. - Continue present medications. - If patient wishes to proceed, schedule standard esophageal pH/impedance and manometry testing.   YOU HAD AN ENDOSCOPIC PROCEDURE TODAY AT THE [facility]:   Refer to the procedure report that was given to you for any specific questions about what was found during the examination.  If the procedure report does not answer your questions, please call your gastroenterologist to clarify.  If you requested that your care partner not be given the details of your procedure findings, then the procedure report has been included in a sealed envelope for you to review at your convenience later.  YOU SHOULD EXPECT: Some feelings of bloating in the abdomen. Passage of more gas than usual.  Walking can help get rid of the air that was put into your GI tract during the procedure and reduce the bloating. If you had a lower endoscopy (such as a colonoscopy or flexible sigmoidoscopy) you may notice spotting of blood in your stool or on the toilet paper. If you underwent a bowel prep for your procedure, you may not have a normal bowel movement for a few days.  Please Note:  You might notice some irritation and congestion in your nose or some drainage.  This is from the oxygen used during your procedure.  There is no need for concern and it should clear up in a day or so.  Following upper endoscopy (EGD)  Vomiting of blood or coffee ground material  New chest pain or pain under the shoulder blades  Painful or persistently difficult swallowing  New shortness of breath  Fever of 100F or higher  Black, tarry-looking stools  For urgent or emergent issues, a gastroenterologist can be reached at any hour by calling [phone]. Do not use MyChart messaging for urgent concerns.    DIET:  We do recommend a small meal at first, but then you may proceed to your regular diet.  Drink plenty of fluids but you should avoid alcoholic beverages for 24  hours.  ACTIVITY:  You should plan to take it easy for the rest of today and you should NOT DRIVE or use heavy machinery until tomorrow (because of the sedation medicines used during the test).    FOLLOW UP: Our staff will call the number listed on your records the next business day following your procedure.  We will call around 7:15- 8:00 am to check on you and address any questions or concerns that you may have regarding the information given to you following your procedure. If we do not reach you, we will leave a message.     If any biopsies were taken you will be contacted by phone or by letter within the next 1-3 weeks.  Please call us at [phone] if you have not heard about the biopsies in 3 weeks.    SIGNATURES/CONFIDENTIALITY: You and/or your care partner have signed paperwork which will be entered into your electronic medical record.  These signatures attest to the fact that that the information above on your After Visit Summary has been reviewed and is understood.  Full responsibility of the confidentiality of this discharge information lies with you and/or your care-partner.

## 2023-07-02 NOTE — Progress Notes (Signed)
History and Physical:  This patient presents for endoscopic testing for: Encounter Diagnosis  Name Primary?   Chronic cough Yes    Clinical details in 05/20/23 office consult note Chronic cough - ? GERD as a contributing factor.  Patient is otherwise without complaints or active issues today.   Past Medical History: Past Medical History:  Diagnosis Date   Anxiety    Cold sore      Past Surgical History: Past Surgical History:  Procedure Laterality Date   ANKLE ARTHROPLASTY Left 2007   CESAREAN SECTION     x2   TOE SURGERY Left 2018    Allergies: No Known Allergies  Outpatient Meds: Current Outpatient Medications  Medication Sig Dispense Refill   citalopram (CELEXA) 40 MG tablet TAKE 1 TABLET BY MOUTH EVERY DAY 30 tablet 0   estradiol (ESTRACE) 0.1 MG/GM vaginal cream PLACE 1 APPLICATORFUL VAGINALLY 2 (TWO) TIMES A WEEK. 42.5 g 0   OVER THE COUNTER MEDICATION Pt using a nasal spray     pantoprazole (PROTONIX) 40 MG tablet TAKE 1 TABLET BY MOUTH EVERY DAY 30 tablet 0   penciclovir (DENAVIR) 1 % cream Apply topically every 3 (three) hours while awake. For 4 days 5 g 0   valACYclovir (VALTREX) 1000 MG tablet Take 2 tablets (2,000 mg total) by mouth 2 (two) times daily. For one day 15 tablet 0   fluticasone (FLONASE) 50 MCG/ACT nasal spray Place 2 sprays into both nostrils daily. (Patient not taking: Reported on 05/20/2023) 16 g 0   Current Facility-Administered Medications  Medication Dose Route Frequency Provider Last Rate Last Admin   0.9 %  sodium chloride infusion  500 mL Intravenous Continuous Danis, Starr Lake III, MD          ___________________________________________________________________ Objective   Exam:  BP (!) 152/63   Pulse (!) 54   Temp (!) 97.3 F (36.3 C)   Resp 13   Ht 5\' 5"  (1.651 m)   Wt 177 lb (80.3 kg)   SpO2 97%   BMI 29.45 kg/m   CV: regular , S1/S2 Resp: clear to auscultation bilaterally, normal RR and effort noted GI: soft, no  tenderness, with active bowel sounds.   Assessment: Encounter Diagnosis  Name Primary?   Chronic cough Yes     Plan:  EGD with Bravo study  The benefits and risks of the planned procedure were described in detail with the patient or (when appropriate) their health care proxy.  Risks were outlined as including, but not limited to, bleeding, infection, perforation, adverse medication reaction leading to cardiac or pulmonary decompensation, pancreatitis (if ERCP).  The limitation of incomplete mucosal visualization was also discussed.  No guarantees or warranties were given.  The patient is appropriate for an endoscopic procedure in the ambulatory setting.   - Amada Jupiter, MD

## 2023-07-02 NOTE — Progress Notes (Signed)
Vss nad trans to pacu 

## 2023-07-02 NOTE — Progress Notes (Signed)
Procedure started pt spasmed  scope withdrawn.  Pt bagged with + pressure bag vetilation.  Spasm relieved.  Patient stable  Procedure aborted due to hyper reactive airway.  Pt to pacu vss nad trans to pacu  Pt awake vss

## 2023-07-10 ENCOUNTER — Encounter: Payer: BC Managed Care – PPO | Admitting: Nurse Practitioner

## 2023-07-11 ENCOUNTER — Other Ambulatory Visit: Payer: Self-pay

## 2023-07-11 DIAGNOSIS — R053 Chronic cough: Secondary | ICD-10-CM

## 2023-07-21 ENCOUNTER — Telehealth: Payer: Self-pay | Admitting: Gastroenterology

## 2023-07-21 NOTE — Telephone Encounter (Signed)
 PT is calling to find out if she can get the EM done earlier than May. She is coughing a lot more and wants it done sooner. Please advise.

## 2023-07-27 ENCOUNTER — Other Ambulatory Visit: Payer: Self-pay | Admitting: Nurse Practitioner

## 2023-07-27 DIAGNOSIS — R053 Chronic cough: Secondary | ICD-10-CM

## 2023-07-28 ENCOUNTER — Other Ambulatory Visit: Payer: Self-pay | Admitting: Nurse Practitioner

## 2023-07-28 DIAGNOSIS — F411 Generalized anxiety disorder: Secondary | ICD-10-CM

## 2023-08-11 ENCOUNTER — Encounter: Payer: BC Managed Care – PPO | Admitting: Nurse Practitioner

## 2023-08-20 ENCOUNTER — Encounter: Payer: Self-pay | Admitting: Gastroenterology

## 2023-09-03 ENCOUNTER — Other Ambulatory Visit: Payer: Self-pay | Admitting: Nurse Practitioner

## 2023-09-03 ENCOUNTER — Encounter: Payer: Self-pay | Admitting: Nurse Practitioner

## 2023-09-03 DIAGNOSIS — R053 Chronic cough: Secondary | ICD-10-CM

## 2023-09-04 ENCOUNTER — Other Ambulatory Visit: Payer: Self-pay | Admitting: Nurse Practitioner

## 2023-09-04 DIAGNOSIS — F411 Generalized anxiety disorder: Secondary | ICD-10-CM

## 2023-09-05 NOTE — Telephone Encounter (Signed)
 Called patient states she has had mild improvement of cough. She still feels like she is losing her breath when coughing.

## 2023-09-05 NOTE — Telephone Encounter (Signed)
 Reached out to patient per request in phone note. She has not seen much improvement. Does have appointment with pulmonology in April.

## 2023-09-08 NOTE — Telephone Encounter (Signed)
 I see that Jennifer Soto has her setup for an esophageal manometry in may

## 2023-10-14 ENCOUNTER — Encounter: Payer: Self-pay | Admitting: Nurse Practitioner

## 2023-10-14 ENCOUNTER — Ambulatory Visit (INDEPENDENT_AMBULATORY_CARE_PROVIDER_SITE_OTHER): Payer: BC Managed Care – PPO | Admitting: Nurse Practitioner

## 2023-10-14 VITALS — BP 136/88 | HR 65 | Temp 98.0°F | Ht 64.5 in | Wt 181.8 lb

## 2023-10-14 DIAGNOSIS — R053 Chronic cough: Secondary | ICD-10-CM | POA: Diagnosis not present

## 2023-10-14 DIAGNOSIS — Z131 Encounter for screening for diabetes mellitus: Secondary | ICD-10-CM | POA: Diagnosis not present

## 2023-10-14 DIAGNOSIS — R030 Elevated blood-pressure reading, without diagnosis of hypertension: Secondary | ICD-10-CM | POA: Insufficient documentation

## 2023-10-14 DIAGNOSIS — Z0001 Encounter for general adult medical examination with abnormal findings: Secondary | ICD-10-CM

## 2023-10-14 DIAGNOSIS — E669 Obesity, unspecified: Secondary | ICD-10-CM | POA: Insufficient documentation

## 2023-10-14 DIAGNOSIS — F411 Generalized anxiety disorder: Secondary | ICD-10-CM

## 2023-10-14 DIAGNOSIS — Z13820 Encounter for screening for osteoporosis: Secondary | ICD-10-CM

## 2023-10-14 DIAGNOSIS — Z6830 Body mass index (BMI) 30.0-30.9, adult: Secondary | ICD-10-CM | POA: Diagnosis not present

## 2023-10-14 DIAGNOSIS — E781 Pure hyperglyceridemia: Secondary | ICD-10-CM | POA: Insufficient documentation

## 2023-10-14 DIAGNOSIS — Z0000 Encounter for general adult medical examination without abnormal findings: Secondary | ICD-10-CM

## 2023-10-14 LAB — LIPID PANEL
Cholesterol: 256 mg/dL — ABNORMAL HIGH (ref 0–200)
HDL: 38 mg/dL — ABNORMAL LOW (ref >39.00)
LDL Cholesterol: 146 mg/dL — ABNORMAL HIGH (ref 0–99)
NonHDL: 217.64
Total CHOL/HDL Ratio: 7
Triglycerides: 360 mg/dL — ABNORMAL HIGH (ref 0.0–149.0)
VLDL: 72 mg/dL — ABNORMAL HIGH (ref 0.0–40.0)

## 2023-10-14 LAB — CBC
HCT: 40 % (ref 36.0–46.0)
Hemoglobin: 14 g/dL (ref 12.0–15.0)
MCHC: 34.9 g/dL (ref 30.0–36.0)
MCV: 90.5 fl (ref 78.0–100.0)
Platelets: 189 10*3/uL (ref 150.0–400.0)
RBC: 4.42 Mil/uL (ref 3.87–5.11)
RDW: 12.7 % (ref 11.5–15.5)
WBC: 7.5 10*3/uL (ref 4.0–10.5)

## 2023-10-14 LAB — COMPREHENSIVE METABOLIC PANEL WITH GFR
ALT: 30 U/L (ref 0–35)
AST: 22 U/L (ref 0–37)
Albumin: 4.6 g/dL (ref 3.5–5.2)
Alkaline Phosphatase: 71 U/L (ref 39–117)
BUN: 16 mg/dL (ref 6–23)
CO2: 31 meq/L (ref 19–32)
Calcium: 9.8 mg/dL (ref 8.4–10.5)
Chloride: 101 meq/L (ref 96–112)
Creatinine, Ser: 0.79 mg/dL (ref 0.40–1.20)
GFR: 79.92 mL/min (ref >60.00)
Glucose, Bld: 91 mg/dL (ref 70–99)
Potassium: 5.2 meq/L — ABNORMAL HIGH (ref 3.5–5.1)
Sodium: 139 meq/L (ref 135–145)
Total Bilirubin: 0.4 mg/dL (ref 0.2–1.2)
Total Protein: 7.3 g/dL (ref 6.0–8.3)

## 2023-10-14 LAB — HEMOGLOBIN A1C: Hgb A1c MFr Bld: 5.6 % (ref 4.6–6.5)

## 2023-10-14 LAB — TSH: TSH: 1.23 u[IU]/mL (ref 0.35–5.50)

## 2023-10-14 NOTE — Assessment & Plan Note (Addendum)
 Patient was placed on  nasal spray and PPI.  She is following with GI currently and has testing set up later this month

## 2023-10-14 NOTE — Patient Instructions (Signed)
 Nice to see you today I will be in touch with the labs once I have them Follow up with me in 1 year for your next physical Follow up in 3 months if you start the weight loss injectables Zepbound and wegovy are the two weight loss injectables on the market

## 2023-10-14 NOTE — Progress Notes (Signed)
 Established Patient Office Visit  Subjective   Patient ID: Jennifer Soto, female    DOB: [birth year]  Age: 63 y.o. MRN: [id number]  Chief Complaint  Patient presents with   Annual Exam    Pt would like full lab work. Pt wants to check her cholesterol and diabetes due to being concerned about her weight.     HPI  for complete physical and follow up of chronic conditions.  GAD: Patient is currently maintainte don citalopram 40mg  daily. States that it does well. She will take melatonin to help sleep.    GERD: on protonix 40mg  and is followed by GI . States that it is helping some but she is still coughing   Immunizations: -Tetanus: Completed in 2023 -Influenza: utd -Shingles: Completed Shingrix series -Pneumonia: too young  Diet: Fair diet. She ies eating 2-3 meals a day. She is having few snacks. She is driniking coffee in the am and water with occ sprite Exercise:  She is doing aerobic swimming and aerobics at the [employer] exam: Completes annually. Wears glasses and contacts  Dental exam: Completes semi-annually    Colonoscopy: Completed in [facility]?  Need record release Lung Cancer Screening: N/A  Pap smear: 04/22/2022 that was negative goes to [facility]  Mammogram: 06/21/2023  Dexa: due order placed today  Sleep: goes to bed around 10 and gets up 630-7 and feels rested. Does snore some. Does not snore heavy        Review of Systems  Constitutional:  Negative for chills and fever.  Respiratory:  Negative for shortness of breath.   Cardiovascular:  Negative for chest pain and leg swelling.  Gastrointestinal:  Negative for abdominal pain, blood in stool, constipation, diarrhea, nausea and vomiting.       BM daily   Genitourinary:  Negative for dysuria and hematuria.  Neurological:  Negative for tingling and headaches.  Psychiatric/Behavioral:  Negative for hallucinations and suicidal ideas.       Objective:     BP 136/88   Pulse 65   Temp 98 F (36.7  C) (Oral)   Ht 5' 4.5" (1.638 m)   Wt 181 lb 12.8 oz (82.5 kg)   SpO2 94%   BMI 30.72 kg/m  BP Readings from Last 3 Encounters:  10/14/23 136/88  07/02/23 139/66  05/20/23 126/80   Wt Readings from Last 3 Encounters:  10/14/23 181 lb 12.8 oz (82.5 kg)  07/02/23 177 lb (80.3 kg)  05/20/23 177 lb (80.3 kg)   SpO2 Readings from Last 3 Encounters:  10/14/23 94%  07/02/23 97%  11/11/22 96%      Physical Exam Vitals and nursing note reviewed.  Constitutional:      Appearance: Normal appearance.  HENT:     Right Ear: Tympanic membrane, ear canal and external ear normal.     Left Ear: Tympanic membrane, ear canal and external ear normal.     Mouth/Throat:     Mouth: Mucous membranes are moist.     Pharynx: Oropharynx is clear.  Eyes:     Extraocular Movements: Extraocular movements intact.     Pupils: Pupils are equal, round, and reactive to light.  Cardiovascular:     Rate and Rhythm: Normal rate and regular rhythm.     Pulses: Normal pulses.     Heart sounds: Normal heart sounds.  Pulmonary:     Effort: Pulmonary effort is normal.     Breath sounds: Normal breath sounds.  Abdominal:  General: Bowel sounds are normal. There is no distension.     Palpations: There is no mass.     Tenderness: There is no abdominal tenderness.     Hernia: No hernia is present.  Musculoskeletal:     Right lower leg: No edema.     Left lower leg: No edema.  Lymphadenopathy:     Cervical: No cervical adenopathy.  Skin:    General: Skin is warm.  Neurological:     General: No focal deficit present.     Mental Status: She is alert.     Deep Tendon Reflexes:     Reflex Scores:      Bicep reflexes are 2+ on the right side and 2+ on the left side.      Patellar reflexes are 2+ on the right side and 2+ on the left side.    Comments: Bilateral upper and lower extremity strength 5/5  Psychiatric:        Mood and Affect: Mood normal.        Behavior: Behavior normal.        Thought  Content: Thought content normal.        Judgment: Judgment normal.      No results found for any visits on 10/14/23.    The 10-year ASCVD risk score (Arnett DK, et al., 2019) is: 6.3%    Assessment & Plan:   Problem List Items Addressed This Visit       Other   Preventative health care - Primary   Discussed age-appropriate immunizations and screening exams.  Did review patient's personal, surgical, social, family histories.  Patient is up-to-date on all age-appropriate vaccinations she would like.  Patient states she is up-to-date on CRC screening.  Pending record release.  Patient up-to-date on mammogram.  Order placed for DEXA scan for screening for osteoporosis.  Patient was given information at discharge about preventative healthcare maintenance with anticipatory guidance.  Patient's x-ray also up-to-date      Relevant Orders   CBC   Comprehensive metabolic panel with GFR   GAD (generalized anxiety disorder)   Patient currently maintained on citalopram 40 mg daily.  Patient denies HI/SI/AVH.  States she is doing well with the medication.  Continue medication as prescribed      Relevant Orders   TSH   Chronic cough   Patient was placed on  nasal spray and PPI.  She is following with GI currently and has testing set up later this month      Hypertriglyceridemia   History of the same.  Pending lipid panel continue working healthy lifestyle modifications      Relevant Orders   Lipid panel   Elevated blood pressure reading   Elevated initial check but better upon recheck.  Continue working lifestyle modifications      Obesity (BMI 30-39.9)   Pending A1c, lipid panel, TSH.  Did discuss with patient lifestyle modifications and patient is interested in GLP-1 receptor agonist therapy.  No personal or family history of medullary thyroid cancer that she is aware of.  States her mother did have thyroid cancer with a partial thyroidectomy.      Other Visit Diagnoses        Screening for diabetes mellitus       Relevant Orders   Comprehensive metabolic panel with GFR     Screening for osteoporosis       Relevant Orders   DG Bone Density       Return in about  1 year (around 10/13/2024) for CPE and Labs.    Audria Nine, NP

## 2023-10-14 NOTE — Assessment & Plan Note (Signed)
 Elevated initial check but better upon recheck.  Continue working lifestyle modifications

## 2023-10-14 NOTE — Assessment & Plan Note (Signed)
 Patient currently maintained on citalopram 40 mg daily.  Patient denies HI/SI/AVH.  States she is doing well with the medication.  Continue medication as prescribed

## 2023-10-14 NOTE — Assessment & Plan Note (Signed)
 Pending A1c, lipid panel, TSH.  Did discuss with patient lifestyle modifications and patient is interested in GLP-1 receptor agonist therapy.  No personal or family history of medullary thyroid cancer that she is aware of.  States her mother did have thyroid cancer with a partial thyroidectomy.

## 2023-10-14 NOTE — Assessment & Plan Note (Signed)
 Discussed age-appropriate immunizations and screening exams.  Did review patient's personal, surgical, social, family histories.  Patient is up-to-date on all age-appropriate vaccinations she would like.  Patient states she is up-to-date on CRC screening.  Pending record release.  Patient up-to-date on mammogram.  Order placed for DEXA scan for screening for osteoporosis.  Patient was given information at discharge about preventative healthcare maintenance with anticipatory guidance.  Patient's x-ray also up-to-date

## 2023-10-14 NOTE — Assessment & Plan Note (Signed)
 History of the same.  Pending lipid panel continue working healthy lifestyle modifications

## 2023-10-15 ENCOUNTER — Encounter: Payer: Self-pay | Admitting: Nurse Practitioner

## 2023-10-15 DIAGNOSIS — R053 Chronic cough: Secondary | ICD-10-CM

## 2023-10-22 ENCOUNTER — Ambulatory Visit (INDEPENDENT_AMBULATORY_CARE_PROVIDER_SITE_OTHER)

## 2023-10-22 ENCOUNTER — Ambulatory Visit: Payer: 59 | Admitting: Pulmonary Disease

## 2023-10-22 ENCOUNTER — Encounter: Payer: Self-pay | Admitting: Pulmonary Disease

## 2023-10-22 VITALS — BP 144/82 | HR 64 | Temp 98.1°F | Ht 65.5 in | Wt 181.0 lb

## 2023-10-22 DIAGNOSIS — R053 Chronic cough: Secondary | ICD-10-CM

## 2023-10-22 MED ORDER — FLUTICASONE FUROATE-VILANTEROL 100-25 MCG/ACT IN AEPB
1.0000 | INHALATION_SPRAY | Freq: Every day | RESPIRATORY_TRACT | 5 refills | Status: DC
Start: 2023-10-22 — End: 2024-04-26

## 2023-10-22 NOTE — Patient Instructions (Signed)
 I will see you back in about 2 months  Obtain chest x-ray  Schedule for PFT with methacholine challenge study  Therapeutic trial with Breo-inhaler to keep the breathing tubes open  Flonase or other inhaled steroid to help with nasal stuffiness congestion, postnasal drip

## 2023-10-22 NOTE — Progress Notes (Signed)
 Jennifer Soto    [id number]    06-22-1961  Primary Care Physician:Cable, Jennifer Churn, NP  Referring Physician: Eden Emms, NP [address] [place],  [place] [address]  Chief complaint:   Chronic cough about 3 to 4 years  HPI:  Has seen ENT Concern for reflux - Did use a PPI, elevation of the head of the bed, behavioral modifications have not really helped  Cough is worse when trying to lay down  She does have occasional wheezes Does not have a history of chronic lung disease  Never smoker  Has always done office work  Does have a pet dog - Does not associate exposure to the dog with her symptoms  Occasionally using cough drops does help  ENT evaluation - No nasal polyps  She does have throat clearing but does not have significant nasal stuffiness or congestion  Pets: Occupation: Exposures: Smoking history: Travel history: Relevant family history:  Outpatient Encounter Medications as of 10/22/2023  Medication Sig   citalopram (CELEXA) 40 MG tablet TAKE 1 TABLET (40 MG TOTAL) BY MOUTH DAILY. NEED OFFICE VISIT FOR FURTHER REFILLS   estradiol (ESTRACE) 0.1 MG/GM vaginal cream PLACE 1 APPLICATORFUL VAGINALLY 2 (TWO) TIMES A WEEK.   fluticasone (FLONASE) 50 MCG/ACT nasal spray Place 2 sprays into both nostrils daily.   fluticasone furoate-vilanterol (BREO ELLIPTA) 100-25 MCG/ACT AEPB Inhale 1 puff into the lungs daily.   OVER THE COUNTER MEDICATION Pt using a nasal spray   pantoprazole (PROTONIX) 40 MG tablet TAKE 1 TABLET (40 MG TOTAL) BY MOUTH DAILY. NEED OFFICE VISIT FOR FURTHER REFILLS   penciclovir (DENAVIR) 1 % cream Apply topically every 3 (three) hours while awake. For 4 days   valACYclovir (VALTREX) 1000 MG tablet Take 2 tablets (2,000 mg total) by mouth 2 (two) times daily. For one day   No facility-administered encounter medications on file as of 10/22/2023.    Allergies as of 10/22/2023   (No Known Allergies)    Past Medical History:   Diagnosis Date   Anxiety    Cold sore     Past Surgical History:  Procedure Laterality Date   ANKLE ARTHROPLASTY Left 2007   CESAREAN SECTION     x2   TOE SURGERY Left 2018    Family History  Problem Relation Age of Onset   Thyroid disease Mother    Arrhythmia Father        afib    Social History   Socioeconomic History   Marital status: Married    Spouse name: Jennifer Soto   Number of children: 2   Years of education: Not on file   Highest education level: Associate degree: occupational, Scientist, product/process development, or vocational program  Occupational History   Occupation: retired  Tobacco Use   Smoking status: Never   Smokeless tobacco: Never  Vaping Use   Vaping status: Never Used  Substance and Sexual Activity   Alcohol use: Not Currently    Alcohol/week: 1.0 standard drink of alcohol    Types: 1 Glasses of wine per week   Drug use: Never   Sexual activity: Yes    Birth control/protection: Post-menopausal  Other Topics Concern   Not on file  Social History Narrative   Jennifer Soto,35 Jennifer Soto 4      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)   Overall Physicist, medical Strain (  CARDIA)    Difficulty of Paying Living Expenses: Not hard at all  Food Insecurity: No Food Insecurity (11/08/2022)   Hunger Vital Sign    Worried About Running Out of Food in the Last Year: Never true    Ran Out of Food in the Last Year: Never true  Transportation Needs: No Transportation Needs (11/08/2022)   PRAPARE - Administrator, Civil Service (Medical): No    Lack of Transportation (Non-Medical): No  Physical Activity: Unknown (11/08/2022)   Exercise Vital Sign    Days of Exercise per Week: 2 days    Minutes of Exercise per Session: Patient declined  Stress: No Stress Concern Present (11/08/2022)   [employer] of Occupational Health - Occupational Stress Questionnaire    Feeling of Stress :  Not at all  Social Connections: Socially Integrated (11/08/2022)   Social Connection and Isolation Panel [NHANES]    Frequency of Communication with Friends and Family: Three times a week    Frequency of Social Gatherings with Friends and Family: Once a week    Attends Religious Services: More than 4 times per year    Active Member of [employer] or Organizations: Yes    Attends Engineer, structural: More than 4 times per year    Marital Status: Married  Catering manager Violence: Not on file    Review of Systems  Respiratory:  Positive for cough.   Psychiatric/Behavioral:  Negative for sleep disturbance.     Vitals:   10/22/23 1007  BP: (!) 144/82  Pulse: 64  Temp: 98.1 F (36.7 C)  SpO2: 98%     Physical Exam Constitutional:      Appearance: Normal appearance.  HENT:     Head: Normocephalic.     Mouth/Throat:     Mouth: Mucous membranes are moist.  Eyes:     General: No scleral icterus. Cardiovascular:     Rate and Rhythm: Normal rate and regular rhythm.     Heart sounds: No murmur heard.    No friction rub.  Pulmonary:     Effort: No respiratory distress.     Breath sounds: No stridor. No wheezing or rhonchi.  Musculoskeletal:     Cervical back: No rigidity or tenderness.  Neurological:     Mental Status: She is alert.  Psychiatric:        Mood and Affect: Mood normal.    Data Reviewed: Chest x-ray 2024-no acute infiltrate  Assessment:  Chronic cough about 3 to 4 years - PPIs have not really helped -Behavioral modifications to reduce reflux have not really helped  She does have throat clearing, concern for postnasal drip - She has used nasal steroids previously - Allergy medications  Concern for respiratory cause for cough - No underlying lung disease - Never smoker - She does have occasional wheezing  Plan/Recommendations: Empiric bronchodilator-Breo  Schedule patient for pulmonary function test with methacholine challenge study  Continue  behavioral modifications/medications for reflux  May have multiple factors contributing to the cough  Scheduled for repeat chest x-ray  Follow-up in about 2 months  Encouraged to call with significant concerns     Jennifer Diamond MD [facility] 10/22/2023, 10:33 AM  CC: Jennifer Emms, NP

## 2023-10-27 ENCOUNTER — Other Ambulatory Visit: Payer: Self-pay | Admitting: Nurse Practitioner

## 2023-10-27 DIAGNOSIS — N941 Unspecified dyspareunia: Secondary | ICD-10-CM

## 2023-10-27 DIAGNOSIS — R053 Chronic cough: Secondary | ICD-10-CM

## 2023-10-27 DIAGNOSIS — F411 Generalized anxiety disorder: Secondary | ICD-10-CM

## 2023-10-30 LAB — HM DEXA SCAN

## 2023-11-03 ENCOUNTER — Encounter: Payer: Self-pay | Admitting: Nurse Practitioner

## 2023-11-03 DIAGNOSIS — R053 Chronic cough: Secondary | ICD-10-CM

## 2023-11-03 DIAGNOSIS — M810 Age-related osteoporosis without current pathological fracture: Secondary | ICD-10-CM

## 2023-11-03 MED ORDER — ALENDRONATE SODIUM 70 MG PO TABS: 70.0000 mg | ORAL_TABLET | ORAL | 11 refills | Status: AC

## 2023-11-03 MED ORDER — CITALOPRAM HYDROBROMIDE 40 MG PO TABS: 40.0000 mg | ORAL_TABLET | Freq: Every day | ORAL | 2 refills | Status: AC

## 2023-11-03 NOTE — Telephone Encounter (Signed)
 The citalopram  will have to go in a new message. It will not allow me to add it to this older message.

## 2023-11-03 NOTE — Addendum Note (Signed)
 Addended by: Franne Ivory on: 11/03/2023 10:43 AM   Modules accepted: Orders

## 2023-11-04 MED ORDER — PANTOPRAZOLE SODIUM 40 MG PO TBEC: 40.0000 mg | DELAYED_RELEASE_TABLET | Freq: Every day | ORAL | 0 refills | Status: DC

## 2023-11-04 NOTE — Telephone Encounter (Signed)
 Citalopram  was sent in but patient also needs pantoprazole  sent in.

## 2023-11-04 NOTE — Addendum Note (Signed)
 Addended by: Dorothe Gaster on: 11/04/2023 11:01 AM   Modules accepted: Orders

## 2023-11-14 ENCOUNTER — Encounter: Payer: Self-pay | Admitting: Gastroenterology

## 2023-11-17 ENCOUNTER — Telehealth: Payer: Self-pay

## 2023-11-17 NOTE — Telephone Encounter (Signed)
 Procedure:EGD Procedure date: 11/26/23 Procedure location: [facility] Arrival Time: 9:00 Spoke with the patient Y/N: Y Any prep concerns? N  Has the patient obtained the prep from the pharmacy ? N Do you have a care partner and transportation: Y Any additional concerns? N

## 2023-11-26 ENCOUNTER — Ambulatory Visit (HOSPITAL_COMMUNITY)
Admission: RE | Admit: 2023-11-26 | Discharge: 2023-11-26 | Disposition: A | Discharge status: Home / Self Care | Payer: Self-pay | Attending: Gastroenterology | Admitting: Gastroenterology

## 2023-11-26 ENCOUNTER — Encounter (HOSPITAL_COMMUNITY)
Admission: RE | Disposition: A | Discharge status: Home / Self Care | Payer: Self-pay | Source: Home / Self Care | Attending: Gastroenterology

## 2023-11-26 ENCOUNTER — Encounter (HOSPITAL_COMMUNITY): Payer: Self-pay | Admitting: Gastroenterology

## 2023-11-26 DIAGNOSIS — K219 Gastro-esophageal reflux disease without esophagitis: Secondary | ICD-10-CM | POA: Diagnosis not present

## 2023-11-26 DIAGNOSIS — R053 Chronic cough: Secondary | ICD-10-CM | POA: Diagnosis present

## 2023-11-26 HISTORY — PX: ESOPHAGEAL MANOMETRY: SHX5429

## 2023-11-26 SURGERY — MANOMETRY, ESOPHAGUS
Anesthesia: Choice

## 2023-11-26 MED ORDER — LIDOCAINE VISCOUS HCL 2 % MT SOLN
OROMUCOSAL | Status: AC
  Filled 2023-11-26: qty 15

## 2023-11-26 SURGICAL SUPPLY — 2 items
FACESHIELD LNG OPTICON STERILE (SAFETY) IMPLANT
GLOVE BIO SURGEON STRL SZ8 (GLOVE) ×2 IMPLANT

## 2023-11-26 NOTE — Progress Notes (Signed)
 Esophageal Manometry done per protocol. Pt tolerated well with out complication. Ph with impedance done per protocol. Pt tolerated well. Instructions given regarding the study and monitor. Pt verbalized understand and return demonstrated use of monitor. Pt will return tomorrow to have probe removed and monitor downloaded.

## 2023-11-27 ENCOUNTER — Encounter (HOSPITAL_COMMUNITY): Payer: Self-pay | Admitting: Gastroenterology

## 2023-12-05 ENCOUNTER — Telehealth: Payer: Self-pay

## 2023-12-05 NOTE — Telephone Encounter (Signed)
 Copied from CRM [id number]. Topic: Clinical - Lab/Test Results >> Dec 05, 2023 10:54 AM Margarette Shawl wrote: Reason for CRM:   Pt is contacting clinic to see if results have returned from Esophageal manometry ordered by [facility], performed on 05/14. Reviewed chart and unable to see if results have returned.  Pt is requesting call back to verify if results have returned and explain the results  CB#  [phone]  Please advise Dr. Gaynell Keeler

## 2023-12-22 ENCOUNTER — Telehealth: Payer: Self-pay | Admitting: Gastroenterology

## 2023-12-22 ENCOUNTER — Encounter: Payer: Self-pay | Admitting: Pulmonary Disease

## 2023-12-22 ENCOUNTER — Ambulatory Visit: Admitting: Pulmonary Disease

## 2023-12-22 VITALS — BP 158/75 | HR 73 | Ht 65.0 in | Wt 185.6 lb

## 2023-12-22 DIAGNOSIS — R053 Chronic cough: Secondary | ICD-10-CM | POA: Diagnosis not present

## 2023-12-22 MED ORDER — GABAPENTIN 100 MG PO CAPS: 100.0000 mg | ORAL_CAPSULE | Freq: Three times a day (TID) | ORAL | 1 refills | Status: DC

## 2023-12-22 NOTE — Progress Notes (Signed)
 Jennifer Soto    [id number]    14-Mar-1961  Primary Care Physician:Cable, Hoy Mackintosh, NP  Referring Physician: Dorothe Gaster, NP [address] [place],  [place] [address]  Chief complaint:   Chronic cough about 3 to 4 years  HPI:  Has seen ENT Concern for reflux Continues on PPI  Cough is worse with laying down  Was prescribed Breo during the last visit  Was recently seen by GI for manometry - Results pending Cough is worse when trying to lay down  She does have occasional wheezes Does not have a history of chronic lung disease  Never smoker  Has always done office work  Does have a pet dog - Does not associate exposure to the dog with her symptoms  Occasionally using cough drops does help  ENT evaluation - No nasal polyps  She does have throat clearing but does not have significant nasal stuffiness or congestion   Outpatient Encounter Medications as of 12/22/2023  Medication Sig   alendronate  (FOSAMAX ) 70 MG tablet Take 1 tablet (70 mg total) by mouth every 7 (seven) days. Take with a full glass of water on an empty stomach.   citalopram  (CELEXA ) 40 MG tablet Take 1 tablet (40 mg total) by mouth daily.   estradiol  (ESTRACE ) 0.1 MG/GM vaginal cream PLACE 1 APPLICATORFUL VAGINALLY 2 (TWO) TIMES A WEEK.   fluticasone  (FLONASE ) 50 MCG/ACT nasal spray Place 2 sprays into both nostrils daily.   fluticasone  furoate-vilanterol (BREO ELLIPTA ) 100-25 MCG/ACT AEPB Inhale 1 puff into the lungs daily.   OVER THE COUNTER MEDICATION Pt using a nasal spray   pantoprazole  (PROTONIX ) 40 MG tablet Take 1 tablet (40 mg total) by mouth daily. NEED OFFICE VISIT FOR FURTHER REFILLS   penciclovir  (DENAVIR ) 1 % cream Apply topically every 3 (three) hours while awake. For 4 days   valACYclovir  (VALTREX ) 1000 MG tablet Take 2 tablets (2,000 mg total) by mouth 2 (two) times daily. For one day   No facility-administered encounter medications on file as of 12/22/2023.     Allergies as of 12/22/2023   (No Known Allergies)    Past Medical History:  Diagnosis Date   Anxiety    Cold sore     Past Surgical History:  Procedure Laterality Date   ANKLE ARTHROPLASTY Left 2007   CESAREAN SECTION     x2   ESOPHAGEAL MANOMETRY N/A 11/26/2023   Procedure: MANOMETRY, ESOPHAGUS;  Surgeon: Albertina Hugger, MD;  Location: [facility];  Service: Gastroenterology;  Laterality: N/A;  esophageal pH/ impendance and manometry testing  .   TOE SURGERY Left 2018    Family History  Problem Relation Age of Onset   Thyroid  disease Mother    Arrhythmia Father        afib    Social History   Socioeconomic History   Marital status: Married    Spouse name: Leya Paige   Number of children: 2   Years of education: Not on file   Highest education level: Associate degree: occupational, Scientist, product/process development, or vocational program  Occupational History   Occupation: retired  Tobacco Use   Smoking status: Never    Passive exposure: Past   Smokeless tobacco: Never  Vaping Use   Vaping status: Never Used  Substance and Sexual Activity   Alcohol use: Not Currently    Alcohol/week: 1.0 standard drink of alcohol    Types: 1 Glasses of wine per week  Drug use: Never   Sexual activity: Yes    Birth control/protection: Post-menopausal  Other Topics Concern   Not on file  Social History Narrative   Matt,35 Buddie Carina 44      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)   Overall Financial Resource Strain (CARDIA)    Difficulty of Paying Living Expenses: Not hard at all  Food Insecurity: No Food Insecurity (11/08/2022)   Hunger Vital Sign    Worried About Running Out of Food in the Last Year: Never true    Ran Out of Food in the Last Year: Never true  Transportation Needs: No Transportation Needs (11/08/2022)   PRAPARE - Administrator, Civil Service (Medical): No    Lack  of Transportation (Non-Medical): No  Physical Activity: Unknown (11/08/2022)   Exercise Vital Sign    Days of Exercise per Week: 2 days    Minutes of Exercise per Session: Patient declined  Stress: No Stress Concern Present (11/08/2022)   [employer] of Occupational Health - Occupational Stress Questionnaire    Feeling of Stress : Not at all  Social Connections: Socially Integrated (11/08/2022)   Social Connection and Isolation Panel [NHANES]    Frequency of Communication with Friends and Family: Three times a week    Frequency of Social Gatherings with Friends and Family: Once a week    Attends Religious Services: More than 4 times per year    Active Member of [employer] or Organizations: Yes    Attends Engineer, structural: More than 4 times per year    Marital Status: Married  Catering manager Violence: Not on file    Review of Systems  Respiratory:  Positive for cough.   Psychiatric/Behavioral:  Negative for sleep disturbance.     Vitals:   12/22/23 1524  BP: (!) 158/75  Pulse: 73  SpO2: 96%     Physical Exam Constitutional:      Appearance: Normal appearance.  HENT:     Head: Normocephalic.     Mouth/Throat:     Mouth: Mucous membranes are moist.  Eyes:     General: No scleral icterus. Cardiovascular:     Rate and Rhythm: Normal rate and regular rhythm.     Heart sounds: No murmur heard.    No friction rub.  Pulmonary:     Effort: No respiratory distress.     Breath sounds: No stridor. No wheezing or rhonchi.  Musculoskeletal:     Cervical back: No rigidity or tenderness.  Neurological:     Mental Status: She is alert.  Psychiatric:        Mood and Affect: Mood normal.    Data Reviewed: Chest x-ray 2024-no acute infiltrate  Recently had esophageal manometry, results are not available at present    Assessment:  Chronic cough about 3 to 4 years -Continues on Protonix  - Behavioral measures for reflux  With concern for possible airway  hyperresponsiveness - Breo was prescribed but she claims compliance with Jodell Munda and Jodell Munda has not really helped - Will stop Breo at present  Concern for respiratory cause for cough - No underlying lung disease - Never smoker  Plan/Recommendations: Since improved Breo did not help we will stop Breo  Trial with Neurontin, gradually escalate dose from 100 mg - Stop if any side effect of tiredness or sleepiness  Will follow-up on pulmonary function test with methacholine challenge -  This was ordered at last visit but has not been done yet - Patient stated she did not get a call about the study  Continue behavioral modifications to help reflux  Follow-up in about 6 weeks  Encouraged to call with significant concerns  Myer Artis MD [facility] 12/22/2023, 3:32 PM  CC: Dorothe Gaster, NP

## 2023-12-22 NOTE — Telephone Encounter (Signed)
 Patient wishes to discuss results of recent manometry testing from 11/26/23. Advised patient that we do not have the results back yet. Explained to patient that we will call her once the provider has reviewed the results.

## 2023-12-22 NOTE — Patient Instructions (Addendum)
 Will follow-up on the breathing study  Prescription for Neurontin sent to pharmacy for you at 100, 3 times a day  Increase dose about every 4 to 7 days  Start at 100 once a day Then go to 100 twice a day Then go to 100 in the morning and 200 at night The next step up will be using 200 twice a day  If at any point you start feeling more tired than usual or sleepy which is a known side effect of the medication, you may stop the medication or go back to a previous dose as long as it is helping that does not make you sleepy or tired  Follow-up in about 6 weeks

## 2023-12-22 NOTE — Telephone Encounter (Signed)
 Requesting to speak with a nurse in regards to procedure results. Pleas advise.

## 2023-12-27 ENCOUNTER — Encounter: Payer: Self-pay | Admitting: Gastroenterology

## 2024-01-01 ENCOUNTER — Telehealth: Payer: Self-pay

## 2024-01-01 NOTE — Telephone Encounter (Signed)
 Patient informed via My Chart in a separate encounter.

## 2024-01-01 NOTE — Telephone Encounter (Signed)
-----   Message from [facility] sent at 01/01/2024 12:01 PM EDT ----- Regarding: RE: Manometry Results It was normal, no significant abnormality.  The full report will be scanned into epic.  Please inform patient results. Thank you ----- Message ----- From: Laurie Poplar, RN Sent: 12/30/2023   8:32 AM EDT To: Kavitha Nandigam V, MD Subject: Manometry Results                              Patient is inquiring about manometry results. She had her test on 5/14. FYI

## 2024-01-25 ENCOUNTER — Ambulatory Visit: Payer: Self-pay | Admitting: Gastroenterology

## 2024-01-25 ENCOUNTER — Encounter: Payer: Self-pay | Admitting: Gastroenterology

## 2024-01-26 NOTE — Telephone Encounter (Signed)
-----   Message from Victory LITTIE Brand III sent at 01/25/2024  6:08 AM EDT ----- Lynwood,  This patient had esophageal manometry and pH/impedance testing for her longtime chronic cough. I sent her message about these results, but she does not have GERD.  I did not suspect it was likely based on her clinical history when I first saw her and then our experience with the upper  endoscopy, but I wanted this testing done to be sure because she has had such a long journey with this frustrating problem.  I do not know the experience with and/or interest in her ENT provider to consider treatment for upper airway cough syndrome, but I think she would benefit from a pulmonary consultation if not already  done or arranged.  This will probably be a disappointment to her, but I hope she will see it is good news that we can stop going down this road.  Rickey [facility] ----- Message ----- From: Bascom Darice BIRCH, RN Sent: 01/21/2024   2:21 PM EDT To: Victory LITTIE Brand DOUGLAS, MD

## 2024-01-28 ENCOUNTER — Ambulatory Visit: Admitting: Pulmonary Disease

## 2024-01-28 VITALS — BP 123/75 | HR 63 | Ht 65.0 in | Wt 182.0 lb

## 2024-01-28 DIAGNOSIS — R053 Chronic cough: Secondary | ICD-10-CM | POA: Diagnosis not present

## 2024-01-28 MED ORDER — GABAPENTIN 100 MG PO CAPS: 100.0000 mg | ORAL_CAPSULE | Freq: Three times a day (TID) | ORAL | 1 refills | Status: DC

## 2024-01-28 NOTE — Progress Notes (Signed)
 Jennifer Soto    [id number]    Nov 15, 1960  Primary Care Physician:Cable, Lynwood HERO, NP  Referring Physician: Wendee [place] HERO, NP [address] [place],  [place] [address]  Chief complaint:   Chronic cough about 3 to 4 years  HPI:  Has seen ENT  Had esophageal manometry performed that was negative No longer on Breo No longer on Protonix   Continues to use Neurontin  3 times a day  She feels much better Cough is better controlled  Breathing has been overall better  Never smoker  Has always done office work  Does have a pet dog - Does not associate exposure to the dog with her symptoms  Occasionally using cough drops does help  ENT evaluation - No nasal polyps  She does have throat clearing but does not have significant nasal stuffiness or congestion   Outpatient Encounter Medications as of 01/28/2024  Medication Sig   alendronate  (FOSAMAX ) 70 MG tablet Take 1 tablet (70 mg total) by mouth every 7 (seven) days. Take with a full glass of water on an empty stomach.   citalopram  (CELEXA ) 40 MG tablet Take 1 tablet (40 mg total) by mouth daily.   estradiol  (ESTRACE ) 0.1 MG/GM vaginal cream PLACE 1 APPLICATORFUL VAGINALLY 2 (TWO) TIMES A WEEK.   fluticasone  (FLONASE ) 50 MCG/ACT nasal spray Place 2 sprays into both nostrils daily.   fluticasone  furoate-vilanterol (BREO ELLIPTA ) 100-25 MCG/ACT AEPB Inhale 1 puff into the lungs daily.   gabapentin  (NEURONTIN ) 100 MG capsule Take 1 capsule (100 mg total) by mouth 3 (three) times daily.   OVER THE COUNTER MEDICATION Pt using a nasal spray   pantoprazole  (PROTONIX ) 40 MG tablet Take 1 tablet (40 mg total) by mouth daily. NEED OFFICE VISIT FOR FURTHER REFILLS   penciclovir  (DENAVIR ) 1 % cream Apply topically every 3 (three) hours while awake. For 4 days   valACYclovir  (VALTREX ) 1000 MG tablet Take 2 tablets (2,000 mg total) by mouth 2 (two) times daily. For one day   No facility-administered encounter medications on  file as of 01/28/2024.    Allergies as of 01/28/2024   (No Known Allergies)    Past Medical History:  Diagnosis Date   Anxiety    Cold sore     Past Surgical History:  Procedure Laterality Date   ANKLE ARTHROPLASTY Left 2007   CESAREAN SECTION     x2   ESOPHAGEAL MANOMETRY N/A 11/26/2023   Procedure: MANOMETRY, ESOPHAGUS;  Surgeon: Legrand Victory LITTIE DOUGLAS, MD;  Location: [facility];  Service: Gastroenterology;  Laterality: N/A;  esophageal pH/ impendance and manometry testing  .   TOE SURGERY Left 2018    Family History  Problem Relation Age of Onset   Thyroid  disease Mother    Arrhythmia Father        afib    Social History   Socioeconomic History   Marital status: Married    Spouse name: Jillaine Waren   Number of children: 2   Years of education: Not on file   Highest education level: Associate degree: occupational, Scientist, product/process development, or vocational program  Occupational History   Occupation: retired  Tobacco Use   Smoking status: Never    Passive exposure: Past   Smokeless tobacco: Never  Vaping Use   Vaping status: Never Used  Substance and Sexual Activity   Alcohol use: Not Currently    Alcohol/week: 1.0 standard drink of alcohol    Types: 1 Glasses of wine  per week   Drug use: Never   Sexual activity: Yes    Birth control/protection: Post-menopausal  Other Topics Concern   Not on file  Social History Narrative   Matt,35 [place] 9      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)   Overall Financial Resource Strain (CARDIA)    Difficulty of Paying Living Expenses: Not hard at all  Food Insecurity: No Food Insecurity (11/08/2022)   Hunger Vital Sign    Worried About Running Out of Food in the Last Year: Never true    Ran Out of Food in the Last Year: Never true  Transportation Needs: No Transportation Needs (11/08/2022)   PRAPARE - Administrator, Civil Service  (Medical): No    Lack of Transportation (Non-Medical): No  Physical Activity: Unknown (11/08/2022)   Exercise Vital Sign    Days of Exercise per Week: 2 days    Minutes of Exercise per Session: Patient declined  Stress: No Stress Concern Present (11/08/2022)   [employer] of Occupational Health - Occupational Stress Questionnaire    Feeling of Stress : Not at all  Social Connections: Socially Integrated (11/08/2022)   Social Connection and Isolation Panel    Frequency of Communication with Friends and Family: Three times a week    Frequency of Social Gatherings with Friends and Family: Once a week    Attends Religious Services: More than 4 times per year    Active Member of [employer] or Organizations: Yes    Attends Engineer, structural: More than 4 times per year    Marital Status: Married  Catering manager Violence: Not on file    Review of Systems  Respiratory:  Positive for cough.   Psychiatric/Behavioral:  Negative for sleep disturbance.     Vitals:   01/28/24 1359 01/28/24 1400  BP:  123/75  Pulse: 63   SpO2: 95%      Physical Exam Constitutional:      Appearance: Normal appearance.  HENT:     Head: Normocephalic.     Mouth/Throat:     Mouth: Mucous membranes are moist.  Eyes:     General: No scleral icterus. Cardiovascular:     Rate and Rhythm: Normal rate and regular rhythm.     Heart sounds: No murmur heard.    No friction rub.  Pulmonary:     Effort: No respiratory distress.     Breath sounds: No stridor. No wheezing or rhonchi.  Musculoskeletal:     Cervical back: No rigidity or tenderness.  Neurological:     Mental Status: She is alert.  Psychiatric:        Mood and Affect: Mood normal.    Data Reviewed: Chest x-ray 2024-no acute infiltrate  Recently had esophageal manometry-unremarkable - Notes by Dr. Victory Craze is reviewed    Assessment:  Chronic cough about 3 to 4 years Has been off Protonix , off Breo - Cough is better with  Neurontin   She continues to use Neurontin  103 times a day  Did discuss possibility of using Neurontin  just once a day at 300 mg Can also consider decreasing the dose which can be used either once at night or 2 times during the day  Has no underlying lung disease   Plan/Recommendations: Continue Neurontin   Consider cutting down the dose  May consider just using Neurontin  at night  Follow-up in about 6  months  Encouraged to call with significant concerns   Jennet Epley MD [facility] 01/28/2024, 2:15 PM  CC: Wendee Lynwood HERO, NP

## 2024-01-28 NOTE — Patient Instructions (Signed)
 I will see you back in 6 months  You can adjust the way you are taking your gabapentin  from 3 times a day to just taking it at night before bed  You may also cut down to twice a day if you continue to feel well  At all costs, we want to keep the cough controlled without side effects  Call us  with significant concerns

## 2024-02-05 ENCOUNTER — Other Ambulatory Visit: Payer: Self-pay | Admitting: Nurse Practitioner

## 2024-02-05 DIAGNOSIS — Z8619 Personal history of other infectious and parasitic diseases: Secondary | ICD-10-CM

## 2024-02-26 ENCOUNTER — Telehealth: Payer: Self-pay

## 2024-02-26 NOTE — Telephone Encounter (Signed)
 Copied from CRM [id number]. Topic: Clinical - Medication Question >> Feb 25, 2024  2:05 PM Jennifer Soto wrote: Reason for CRM: Patient is requesting Soto higher dosage of gabapentin (NEURONTIN) 100 MG capsule  - states the one she originally received only worked temporarily. Requesting Soto call back.   Callback number: [phone]  Dr.Olalere can you please advise

## 2024-03-04 DIAGNOSIS — K219 Gastro-esophageal reflux disease without esophagitis: Secondary | ICD-10-CM

## 2024-03-14 ENCOUNTER — Ambulatory Visit
Admission: EM | Admit: 2024-03-14 | Discharge: 2024-03-14 | Disposition: A | Discharge status: Home / Self Care | Attending: Urgent Care | Admitting: Urgent Care

## 2024-03-14 ENCOUNTER — Ambulatory Visit (INDEPENDENT_AMBULATORY_CARE_PROVIDER_SITE_OTHER)

## 2024-03-14 DIAGNOSIS — S60221A Contusion of right hand, initial encounter: Secondary | ICD-10-CM | POA: Diagnosis not present

## 2024-03-14 DIAGNOSIS — S62655A Nondisplaced fracture of medial phalanx of left ring finger, initial encounter for closed fracture: Secondary | ICD-10-CM | POA: Diagnosis not present

## 2024-03-14 DIAGNOSIS — S62612A Displaced fracture of proximal phalanx of right middle finger, initial encounter for closed fracture: Secondary | ICD-10-CM | POA: Diagnosis not present

## 2024-03-14 DIAGNOSIS — M79641 Pain in right hand: Secondary | ICD-10-CM

## 2024-03-14 DIAGNOSIS — S6000XA Contusion of unspecified finger without damage to nail, initial encounter: Secondary | ICD-10-CM

## 2024-03-14 MED ORDER — IBUPROFEN 800 MG PO TABS: 800.0000 mg | ORAL_TABLET | Freq: Three times a day (TID) | ORAL | 0 refills | Status: DC

## 2024-03-14 NOTE — ED Provider Notes (Signed)
 Jennifer Soto - [facility]  Note:  This document was prepared using Conservation officer, historic buildings and may include unintentional dictation errors.  MRN: [id number] DOB: [birth year]  Subjective:   Jennifer Soto is a 63 y.o. female presenting for 1 day history of suffering an accidental fall absorbing the impact on her right hand.  Cannot recall exact mechanism of injury.  But she has noted bruising, swelling.  Pain is worst over the 3rd and 4th fingers proximally but is mild.  She does have decreased range of motion.  No history of heart disease, kidney disease.  Does not take medications for high blood pressure.  No current facility-administered medications for this encounter.  Current Outpatient Medications:    alendronate  (FOSAMAX ) 70 MG tablet, Take 1 tablet (70 mg total) by mouth every 7 (seven) days. Take with a full glass of water on an empty stomach., Disp: 4 tablet, Rfl: 11   citalopram  (CELEXA ) 40 MG tablet, Take 1 tablet (40 mg total) by mouth daily., Disp: 90 tablet, Rfl: 2   estradiol  (ESTRACE ) 0.1 MG/GM vaginal cream, PLACE 1 APPLICATORFUL VAGINALLY 2 (TWO) TIMES A WEEK., Disp: 42.5 g, Rfl: 0   fluticasone  (FLONASE ) 50 MCG/ACT nasal spray, Place 2 sprays into both nostrils daily., Disp: 16 g, Rfl: 0   fluticasone  furoate-vilanterol (BREO ELLIPTA ) 100-25 MCG/ACT AEPB, Inhale 1 puff into the lungs daily., Disp: 30 each, Rfl: 5   gabapentin  (NEURONTIN ) 100 MG capsule, Take 1 capsule (100 mg total) by mouth 3 (three) times daily., Disp: 90 capsule, Rfl: 1   OVER THE COUNTER MEDICATION, Pt using a nasal spray, Disp: , Rfl:    pantoprazole  (PROTONIX ) 40 MG tablet, Take 1 tablet (40 mg total) by mouth daily. NEED OFFICE VISIT FOR FURTHER REFILLS, Disp: 90 tablet, Rfl: 0   penciclovir  (DENAVIR ) 1 % cream, Apply topically every 3 (three) hours while awake. For 4 days, Disp: 5 g, Rfl: 0   valACYclovir  (VALTREX ) 1000 MG tablet, TAKE 2 TABLETS (2,000 MG TOTAL) BY MOUTH 2 (TWO)  TIMES DAILY. FOR ONE DAY, Disp: 15 tablet, Rfl: 0   No Known Allergies  Past Medical History:  Diagnosis Date   Anxiety    Cold sore      Past Surgical History:  Procedure Laterality Date   ANKLE ARTHROPLASTY Left 2007   CESAREAN SECTION     x2   ESOPHAGEAL MANOMETRY N/A 11/26/2023   Procedure: MANOMETRY, ESOPHAGUS;  Surgeon: Legrand Victory LITTIE DOUGLAS, MD;  Location: [facility];  Service: Gastroenterology;  Laterality: N/A;  esophageal pH/ impendance and manometry testing  .   TOE SURGERY Left 2018    Family History  Problem Relation Age of Onset   Thyroid  disease Mother    Arrhythmia Father        afib    Social History   Tobacco Use   Smoking status: Never    Passive exposure: Past   Smokeless tobacco: Never  Vaping Use   Vaping status: Never Used  Substance Use Topics   Alcohol use: Not Currently    Alcohol/week: 1.0 standard drink of alcohol    Types: 1 Glasses of wine per week   Drug use: Never    ROS   Objective:   Vitals: BP (!) 165/71 (BP Location: Left Arm)   Pulse 65   Temp 98.8 F (37.1 C) (Oral)   Resp 20   SpO2 96%   BP Readings from Last 3 Encounters:  03/14/24 (!) 165/71  01/28/24 123/75  12/22/23 (!) 158/75    Physical Exam Constitutional:      General: She is not in acute distress.    Appearance: Normal appearance. She is well-developed. She is not ill-appearing, toxic-appearing or diaphoretic.  HENT:     Head: Normocephalic and atraumatic.     Nose: Nose normal.     Mouth/Throat:     Mouth: Mucous membranes are moist.  Eyes:     General: No scleral icterus.       Right eye: No discharge.        Left eye: No discharge.     Extraocular Movements: Extraocular movements intact.  Cardiovascular:     Rate and Rhythm: Normal rate.  Pulmonary:     Effort: Pulmonary effort is normal.  Musculoskeletal:       Hands:  Skin:    General: Skin is warm and dry.  Neurological:     General: No focal deficit present.     Mental Status:  She is alert and oriented to person, place, and time.  Psychiatric:        Mood and Affect: Mood normal.        Behavior: Behavior normal.    Patient placed into a ulnar gutter splint immobilizing the 3rd and 4th fingers with the right wrist in slight extension.  Assessment and Plan :   PDMP not reviewed this encounter.  1. Closed displaced fracture of proximal phalanx of right middle finger, initial encounter   2. Contusion of right hand including fingers, initial encounter   3. Right hand pain   4. Nondisplaced fracture of middle phalanx of left ring finger, initial encounter for closed fracture    Splint as above.  Ibuprofen  for pain and inflammation.  Follow-up with [facility] as soon as possible.  Counseled patient on potential for adverse effects with medications prescribed/recommended today, ER and return-to-clinic precautions discussed, patient verbalized understanding.    Jennifer Soto, [place] 03/14/24 [phone]

## 2024-03-14 NOTE — ED Triage Notes (Signed)
 Pt states she fell yesterday-pain to right hand-bruising/swelling noted-taking ibuprofen -NAD-steady gait

## 2024-03-14 NOTE — Discharge Instructions (Addendum)
 Wear the splint at all times. Use ibuprofen  for pain and inflammation. Follow up with [facility] asap.

## 2024-03-17 ENCOUNTER — Other Ambulatory Visit: Payer: Self-pay | Admitting: Nurse Practitioner

## 2024-03-17 DIAGNOSIS — R053 Chronic cough: Secondary | ICD-10-CM

## 2024-03-23 ENCOUNTER — Other Ambulatory Visit: Payer: Self-pay | Admitting: Nurse Practitioner

## 2024-03-23 DIAGNOSIS — N941 Unspecified dyspareunia: Secondary | ICD-10-CM

## 2024-04-12 ENCOUNTER — Encounter: Payer: Self-pay | Admitting: Pulmonary Disease

## 2024-04-13 ENCOUNTER — Encounter: Payer: Self-pay | Admitting: Pulmonary Disease

## 2024-04-14 NOTE — Telephone Encounter (Signed)
**Note De-identified  [place] Obfuscation** Please advise 

## 2024-04-14 NOTE — Telephone Encounter (Signed)
 Duplicate

## 2024-04-15 NOTE — Telephone Encounter (Signed)
 Will be glad to see you back in the office but we should also consider sending you to an ENT doctor  If agreeable, Placed referral to Dr. Soldotova for chronic cough

## 2024-04-17 ENCOUNTER — Other Ambulatory Visit: Payer: Self-pay | Admitting: Pulmonary Disease

## 2024-04-20 NOTE — Telephone Encounter (Signed)
 Last OV 01/28/2024.  OK refill on gabapentin .

## 2024-04-21 ENCOUNTER — Other Ambulatory Visit: Payer: Self-pay | Admitting: Pulmonary Disease

## 2024-04-26 ENCOUNTER — Encounter: Payer: Self-pay | Admitting: Pulmonary Disease

## 2024-04-26 ENCOUNTER — Ambulatory Visit: Admitting: Pulmonary Disease

## 2024-04-26 VITALS — BP 129/78 | HR 76 | Ht 66.0 in | Wt 179.6 lb

## 2024-04-26 DIAGNOSIS — R053 Chronic cough: Secondary | ICD-10-CM

## 2024-04-26 MED ORDER — GABAPENTIN 600 MG PO TABS: ORAL_TABLET | ORAL | 0 refills | Status: DC

## 2024-04-26 NOTE — Progress Notes (Signed)
 Jennifer Soto    [id number]    1960-11-09  Primary Care Physician:Cable, Lynwood HERO, NP  Referring Physician: Wendee Lynwood HERO, NP [address] [place],  [place] [address]  Chief complaint:   Chronic cough about 3 to 4 years  HPI:  Did follow-up with Dr. Tobie of ENT  Currently uses Neurontin  300 mg twice a day - Not having any significant side effects from the Neurontin  and feels that the dose may be increased as she still has a lot of coughing at night  She is no longer on Breo, no longer on Protonix   Cough was better controlled during the previous escalation of treatment  Never smoker  ENT evaluation - No nasal polyps  She does have throat clearing but does not have significant nasal stuffiness or congestion   Outpatient Encounter Medications as of 04/26/2024  Medication Sig   alendronate  (FOSAMAX ) 70 MG tablet Take 1 tablet (70 mg total) by mouth every 7 (seven) days. Take with a full glass of water on an empty stomach.   citalopram  (CELEXA ) 40 MG tablet Take 1 tablet (40 mg total) by mouth daily.   gabapentin  (NEURONTIN ) 100 MG capsule TAKE 1 CAPSULE (100 MG TOTAL) BY MOUTH THREE TIMES DAILY.   pantoprazole  (PROTONIX ) 40 MG tablet Take 1 tablet (40 mg total) by mouth daily.   penciclovir  (DENAVIR ) 1 % cream Apply topically every 3 (three) hours while awake. For 4 days   valACYclovir  (VALTREX ) 1000 MG tablet TAKE 2 TABLETS (2,000 MG TOTAL) BY MOUTH 2 (TWO) TIMES DAILY. FOR ONE DAY   estradiol  (ESTRACE ) 0.1 MG/GM vaginal cream PLACE 1 APPLICATORFUL VAGINALLY 2 (TWO) TIMES A WEEK.   fluticasone  (FLONASE ) 50 MCG/ACT nasal spray Place 2 sprays into both nostrils daily.   fluticasone  furoate-vilanterol (BREO ELLIPTA ) 100-25 MCG/ACT AEPB Inhale 1 puff into the lungs daily.   ibuprofen  (ADVIL ) 800 MG tablet Take 1 tablet (800 mg total) by mouth 3 (three) times daily.   OVER THE COUNTER MEDICATION Pt using a nasal spray   No facility-administered encounter  medications on file as of 04/26/2024.    Allergies as of 04/26/2024   (No Known Allergies)    Past Medical History:  Diagnosis Date   Anxiety    Cold sore     Past Surgical History:  Procedure Laterality Date   ANKLE ARTHROPLASTY Left 2007   CESAREAN SECTION     x2   ESOPHAGEAL MANOMETRY N/A 11/26/2023   Procedure: MANOMETRY, ESOPHAGUS;  Surgeon: Legrand Victory LITTIE DOUGLAS, MD;  Location: [facility];  Service: Gastroenterology;  Laterality: N/A;  esophageal pH/ impendance and manometry testing  .   TOE SURGERY Left 2018    Family History  Problem Relation Age of Onset   Thyroid  disease Mother    Arrhythmia Father        afib    Social History   Socioeconomic History   Marital status: Married    Spouse name: Brandie Lopes   Number of children: 2   Years of education: Not on file   Highest education level: Associate degree: occupational, Scientist, product/process development, or vocational program  Occupational History   Occupation: retired  Tobacco Use   Smoking status: Never    Passive exposure: Past   Smokeless tobacco: Never  Vaping Use   Vaping status: Never Used  Substance and Sexual Activity   Alcohol use: Not Currently    Alcohol/week: 1.0 standard drink of alcohol  Types: 1 Glasses of wine per week   Drug use: Never   Sexual activity: Yes    Birth control/protection: Post-menopausal  Other Topics Concern   Not on file  Social History Narrative   Matt,35 [place] 25      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)   Overall Financial Resource Strain (CARDIA)    Difficulty of Paying Living Expenses: Not hard at all  Food Insecurity: No Food Insecurity (11/08/2022)   Hunger Vital Sign    Worried About Running Out of Food in the Last Year: Never true    Ran Out of Food in the Last Year: Never true  Transportation Needs: No Transportation Needs (11/08/2022)   PRAPARE - Scientist, research (physical sciences) (Medical): No    Lack of Transportation (Non-Medical): No  Physical Activity: Unknown (11/08/2022)   Exercise Vital Sign    Days of Exercise per Week: 2 days    Minutes of Exercise per Session: Patient declined  Stress: No Stress Concern Present (11/08/2022)   [employer] of Occupational Health - Occupational Stress Questionnaire    Feeling of Stress : Not at all  Social Connections: Socially Integrated (11/08/2022)   Social Connection and Isolation Panel    Frequency of Communication with Friends and Family: Three times a week    Frequency of Social Gatherings with Friends and Family: Once a week    Attends Religious Services: More than 4 times per year    Active Member of [employer] or Organizations: Yes    Attends Engineer, structural: More than 4 times per year    Marital Status: Married  Catering manager Violence: Not on file    Review of Systems  Respiratory:  Positive for cough.   Psychiatric/Behavioral:  Negative for sleep disturbance.     Vitals:   04/26/24 1503  BP: 129/78  Pulse: 76  SpO2: 96%   Physical Exam Constitutional:      Appearance: Normal appearance.  HENT:     Head: Normocephalic.     Mouth/Throat:     Mouth: Mucous membranes are moist.  Eyes:     General: No scleral icterus. Cardiovascular:     Rate and Rhythm: Normal rate and regular rhythm.     Heart sounds: No murmur heard.    No friction rub.  Pulmonary:     Effort: No respiratory distress.     Breath sounds: No stridor. No wheezing or rhonchi.  Musculoskeletal:     Cervical back: No rigidity or tenderness.  Neurological:     Mental Status: She is alert.  Psychiatric:        Mood and Affect: Mood normal.    Data Reviewed: Chest x-ray 2024-no acute infiltrate  Recently had esophageal manometry-unremarkable - Notes by Dr. Victory Brand is reviewed   Assessment:  Chronic cough about 3 to 4 years Has been off Protonix , off Breo - Cough is better with  Neurontin  - Currently taking Neurontin  300 mg twice a day - We did agree to increase dose of Neurontin , encouraged to make sure she is watching for side effect profile  Also discussed de-escalating dose if side effects noted  No underlying lung disease has been identified  Plan/Recommendations: Will increase dose of Neurontin  Can take 600 of Neurontin  at night and then 300 in the morning may also escalate to to 600 twice a day depending on  tolerance  Follow-up in about 2 months  Encouraged to call with significant concerns  May consider just using Neurontin  at night  Follow-up in about 6 months  Encouraged to call with significant concerns  We are escalating doses, risk with side effects of course is higher with the higher dose  Jennet Epley MD [facility] 04/26/2024, 3:07 PM  CC: Wendee Lynwood HERO, NP

## 2024-04-26 NOTE — Patient Instructions (Signed)
 I have sent in a prescription for gabapentin  at 600 mg twice a day  You may start up with using 600 mg in the evening and 300 in the morning and escalate to 600 twice a day as needed  Follow-up in about 4 to 6 weeks to see how you are doing  A side effect will be tiredness, sleepiness, if you are having side effects just back off on the dose  Call with any significant concerns

## 2024-06-01 ENCOUNTER — Other Ambulatory Visit: Payer: Self-pay | Admitting: Pulmonary Disease

## 2024-06-29 ENCOUNTER — Ambulatory Visit: Admitting: Pulmonary Disease

## 2024-07-11 ENCOUNTER — Other Ambulatory Visit: Payer: Self-pay | Admitting: Pulmonary Disease

## 2024-08-12 ENCOUNTER — Other Ambulatory Visit: Payer: Self-pay | Admitting: Pulmonary Disease

## 2024-10-18 ENCOUNTER — Encounter: Admitting: Nurse Practitioner
